# Patient Record
Sex: Female | Born: 1950
Health system: Southern US, Community
[De-identification: ages and names within clinical notes are randomized; demographics above are authoritative.]

## PROBLEM LIST (undated history)

## (undated) DIAGNOSIS — E785 Hyperlipidemia, unspecified: Secondary | ICD-10-CM

## (undated) DIAGNOSIS — E119 Type 2 diabetes mellitus without complications: Secondary | ICD-10-CM

## (undated) DIAGNOSIS — I219 Acute myocardial infarction, unspecified: Secondary | ICD-10-CM

## (undated) DIAGNOSIS — H269 Unspecified cataract: Secondary | ICD-10-CM

## (undated) DIAGNOSIS — M1990 Unspecified osteoarthritis, unspecified site: Secondary | ICD-10-CM

## (undated) DIAGNOSIS — I10 Essential (primary) hypertension: Secondary | ICD-10-CM

## (undated) HISTORY — DX: Essential (primary) hypertension: I10

## (undated) HISTORY — DX: Hyperlipidemia, unspecified: E78.5

## (undated) HISTORY — DX: Unspecified cataract: H26.9

## (undated) HISTORY — DX: Unspecified osteoarthritis, unspecified site: M19.90

## (undated) HISTORY — DX: Type 2 diabetes mellitus without complications: E11.9

## (undated) HISTORY — PX: CORONARY ARTERY BYPASS GRAFT: SHX141

## (undated) HISTORY — PX: HERNIA REPAIR: SHX51

## (undated) HISTORY — DX: Acute myocardial infarction, unspecified: I21.9

## (undated) HISTORY — PX: CARDIAC SURGERY: SHX584

---

## 2009-12-16 DIAGNOSIS — I219 Acute myocardial infarction, unspecified: Secondary | ICD-10-CM

## 2009-12-16 HISTORY — DX: Acute myocardial infarction, unspecified: I21.9

## 2013-04-09 DIAGNOSIS — E119 Type 2 diabetes mellitus without complications: Secondary | ICD-10-CM | POA: Diagnosis not present

## 2013-04-09 DIAGNOSIS — R05 Cough: Secondary | ICD-10-CM | POA: Diagnosis not present

## 2013-04-09 DIAGNOSIS — E7800 Pure hypercholesterolemia, unspecified: Secondary | ICD-10-CM | POA: Diagnosis not present

## 2013-04-09 DIAGNOSIS — Z1159 Encounter for screening for other viral diseases: Secondary | ICD-10-CM | POA: Diagnosis not present

## 2013-04-09 DIAGNOSIS — I10 Essential (primary) hypertension: Secondary | ICD-10-CM | POA: Diagnosis not present

## 2013-07-20 DIAGNOSIS — I10 Essential (primary) hypertension: Secondary | ICD-10-CM | POA: Diagnosis not present

## 2013-07-20 DIAGNOSIS — E119 Type 2 diabetes mellitus without complications: Secondary | ICD-10-CM | POA: Diagnosis not present

## 2013-07-20 DIAGNOSIS — I2510 Atherosclerotic heart disease of native coronary artery without angina pectoris: Secondary | ICD-10-CM | POA: Diagnosis not present

## 2013-07-20 DIAGNOSIS — L258 Unspecified contact dermatitis due to other agents: Secondary | ICD-10-CM | POA: Diagnosis not present

## 2013-07-20 DIAGNOSIS — F17200 Nicotine dependence, unspecified, uncomplicated: Secondary | ICD-10-CM | POA: Diagnosis not present

## 2013-07-20 DIAGNOSIS — M79609 Pain in unspecified limb: Secondary | ICD-10-CM | POA: Diagnosis not present

## 2013-10-20 DIAGNOSIS — H6690 Otitis media, unspecified, unspecified ear: Secondary | ICD-10-CM | POA: Diagnosis not present

## 2013-10-20 DIAGNOSIS — F4323 Adjustment disorder with mixed anxiety and depressed mood: Secondary | ICD-10-CM | POA: Diagnosis not present

## 2013-10-20 DIAGNOSIS — E119 Type 2 diabetes mellitus without complications: Secondary | ICD-10-CM | POA: Diagnosis not present

## 2013-11-15 DIAGNOSIS — H35039 Hypertensive retinopathy, unspecified eye: Secondary | ICD-10-CM | POA: Diagnosis not present

## 2013-12-23 DIAGNOSIS — J329 Chronic sinusitis, unspecified: Secondary | ICD-10-CM | POA: Diagnosis not present

## 2013-12-23 DIAGNOSIS — R05 Cough: Secondary | ICD-10-CM | POA: Diagnosis not present

## 2013-12-23 DIAGNOSIS — R059 Cough, unspecified: Secondary | ICD-10-CM | POA: Diagnosis not present

## 2013-12-23 DIAGNOSIS — E119 Type 2 diabetes mellitus without complications: Secondary | ICD-10-CM | POA: Diagnosis not present

## 2013-12-29 DIAGNOSIS — E119 Type 2 diabetes mellitus without complications: Secondary | ICD-10-CM | POA: Diagnosis not present

## 2013-12-29 DIAGNOSIS — H9190 Unspecified hearing loss, unspecified ear: Secondary | ICD-10-CM | POA: Diagnosis not present

## 2014-01-20 DIAGNOSIS — F17200 Nicotine dependence, unspecified, uncomplicated: Secondary | ICD-10-CM | POA: Diagnosis not present

## 2014-01-20 DIAGNOSIS — E119 Type 2 diabetes mellitus without complications: Secondary | ICD-10-CM | POA: Diagnosis not present

## 2014-01-29 LAB — HM MAMMOGRAPHY

## 2014-02-06 DIAGNOSIS — I10 Essential (primary) hypertension: Secondary | ICD-10-CM | POA: Diagnosis not present

## 2014-02-06 DIAGNOSIS — R040 Epistaxis: Secondary | ICD-10-CM | POA: Diagnosis not present

## 2014-02-09 DIAGNOSIS — R040 Epistaxis: Secondary | ICD-10-CM | POA: Diagnosis not present

## 2014-02-16 DIAGNOSIS — R040 Epistaxis: Secondary | ICD-10-CM | POA: Diagnosis not present

## 2014-06-29 ENCOUNTER — Ambulatory Visit (INDEPENDENT_AMBULATORY_CARE_PROVIDER_SITE_OTHER): Payer: Medicare Other | Admitting: Family

## 2014-06-29 ENCOUNTER — Encounter: Payer: Self-pay | Admitting: Family

## 2014-06-29 ENCOUNTER — Encounter (INDEPENDENT_AMBULATORY_CARE_PROVIDER_SITE_OTHER): Payer: Self-pay

## 2014-06-29 VITALS — BP 124/65 | HR 68 | Temp 98.4°F | Ht 62.75 in | Wt 185.0 lb

## 2014-06-29 DIAGNOSIS — I209 Angina pectoris, unspecified: Secondary | ICD-10-CM | POA: Diagnosis not present

## 2014-06-29 DIAGNOSIS — Z130 Encounter for screening for diseases of the blood and blood-forming organs and certain disorders involving the immune mechanism: Secondary | ICD-10-CM | POA: Diagnosis not present

## 2014-06-29 DIAGNOSIS — E785 Hyperlipidemia, unspecified: Secondary | ICD-10-CM

## 2014-06-29 DIAGNOSIS — I25810 Atherosclerosis of coronary artery bypass graft(s) without angina pectoris: Secondary | ICD-10-CM

## 2014-06-29 DIAGNOSIS — I25709 Atherosclerosis of coronary artery bypass graft(s), unspecified, with unspecified angina pectoris: Secondary | ICD-10-CM

## 2014-06-29 DIAGNOSIS — E1169 Type 2 diabetes mellitus with other specified complication: Secondary | ICD-10-CM | POA: Insufficient documentation

## 2014-06-29 DIAGNOSIS — E119 Type 2 diabetes mellitus without complications: Secondary | ICD-10-CM | POA: Insufficient documentation

## 2014-06-29 DIAGNOSIS — E1159 Type 2 diabetes mellitus with other circulatory complications: Secondary | ICD-10-CM | POA: Insufficient documentation

## 2014-06-29 DIAGNOSIS — Z1321 Encounter for screening for nutritional disorder: Secondary | ICD-10-CM

## 2014-06-29 DIAGNOSIS — I10 Essential (primary) hypertension: Secondary | ICD-10-CM | POA: Diagnosis not present

## 2014-06-29 DIAGNOSIS — E559 Vitamin D deficiency, unspecified: Secondary | ICD-10-CM | POA: Diagnosis not present

## 2014-06-29 LAB — POCT GLYCOSYLATED HEMOGLOBIN (HGB A1C): Hemoglobin A1C: 6.9

## 2014-06-29 LAB — POCT UA - MICROALBUMIN: Microalbumin Ur, POC: NEGATIVE mg/L

## 2014-06-29 MED ORDER — ROSUVASTATIN CALCIUM 10 MG PO TABS: 10.0000 mg | ORAL_TABLET | Freq: Every day | ORAL | Status: DC

## 2014-06-29 MED ORDER — CLOPIDOGREL BISULFATE 75 MG PO TABS: 75.0000 mg | ORAL_TABLET | Freq: Every day | ORAL | Status: DC

## 2014-06-29 MED ORDER — SITAGLIPTIN PHOSPHATE 100 MG PO TABS: 100.0000 mg | ORAL_TABLET | Freq: Every day | ORAL | Status: DC

## 2014-06-29 MED ORDER — GLIMEPIRIDE 2 MG PO TABS: 2.0000 mg | ORAL_TABLET | Freq: Every day | ORAL | Status: DC

## 2014-06-29 MED ORDER — METOPROLOL SUCCINATE ER 25 MG PO TB24: 25.0000 mg | ORAL_TABLET | Freq: Every day | ORAL | Status: DC

## 2014-06-29 MED ORDER — QUINAPRIL HCL 10 MG PO TABS: 10.0000 mg | ORAL_TABLET | Freq: Every day | ORAL | Status: DC

## 2014-06-29 NOTE — Patient Instructions (Signed)

## 2014-06-29 NOTE — Progress Notes (Signed)
Subjective:    Patient ID: Heidi Graham, female    DOB: Jul 16, 1951, 63 y.o.   MRN: [id number]  Diabetes She presents for her follow-up diabetic visit. She has type 2 diabetes mellitus. Her disease course has been improving. Pertinent negatives for hypoglycemia include no confusion, dizziness or headaches. Pertinent negatives for diabetes include no blurred vision, no foot paresthesias, no foot ulcerations and no visual change. Pertinent negatives for hypoglycemia complications include no blackouts. Diabetic complications include heart disease. Pertinent negatives for diabetic complications include no CVA or peripheral neuropathy. Risk factors for coronary artery disease include dyslipidemia, diabetes mellitus, hypertension, post-menopausal and sedentary lifestyle. Current diabetic treatment includes oral agent (dual therapy). She is compliant with treatment all of the time. She is following a generally healthy diet. She participates in exercise daily. Her breakfast blood glucose range is generally 130-140 mg/dl. An ACE inhibitor/angiotensin II receptor blocker is being taken. Eye exam is current.  Hyperlipidemia This is a chronic problem. The current episode started more than 1 year ago. The problem is controlled. Recent lipid tests were reviewed and are normal. Exacerbating diseases include diabetes and obesity. She has no history of hypothyroidism. Factors aggravating her hyperlipidemia include smoking and fatty foods. Pertinent negatives include no focal sensory loss, leg pain, myalgias or shortness of breath. Current antihyperlipidemic treatment includes statins. The current treatment provides significant improvement of lipids. Risk factors for coronary artery disease include diabetes mellitus, dyslipidemia, hypertension and post-menopausal.  Hypertension This is a chronic problem. The current episode started more than 1 year ago. The problem has been resolved since onset. The problem is controlled.  Pertinent negatives include no blurred vision, headaches, palpitations, peripheral edema or shortness of breath. Risk factors for coronary artery disease include diabetes mellitus, dyslipidemia, obesity, post-menopausal state and smoking/tobacco exposure. Past treatments include beta blockers and ACE inhibitors. The current treatment provides significant improvement. Hypertensive end-organ damage includes CAD/MI. There is no history of kidney disease, CVA, heart failure or a thyroid problem. There is no history of sleep apnea.   Pt presents to the office to establish care. Pt has the following chronic conditions:   Review of Systems  Constitutional: Negative.   HENT: Negative.   Eyes: Negative.  Negative for blurred vision.  Respiratory: Negative.  Negative for shortness of breath.   Cardiovascular: Negative.  Negative for palpitations.  Gastrointestinal: Negative.   Endocrine: Negative.   Genitourinary: Negative.   Musculoskeletal: Negative.  Negative for myalgias.  Neurological: Negative.  Negative for dizziness and headaches.  Hematological: Negative.   Psychiatric/Behavioral: Negative.  Negative for confusion.  All other systems reviewed and are negative.      Objective:   Physical Exam  Vitals reviewed. Constitutional: She is oriented to person, place, and time. She appears well-developed and well-nourished. No distress.  HENT:  Head: Normocephalic and atraumatic.  Right Ear: External ear normal.  Mouth/Throat: Oropharynx is clear and moist.  Eyes: Pupils are equal, round, and reactive to light.  Neck: Normal range of motion. Neck supple. No thyromegaly present.  Cardiovascular: Normal rate, regular rhythm, normal heart sounds and intact distal pulses.   No murmur heard. Pulmonary/Chest: Effort normal and breath sounds normal. No respiratory distress. She has no wheezes.  Abdominal: Soft. Bowel sounds are normal. She exhibits no distension. There is no tenderness.    Musculoskeletal: Normal range of motion. She exhibits no edema and no tenderness.  Neurological: She is alert and oriented to person, place, and time. She has normal reflexes. No cranial nerve  deficit.  Skin: Skin is warm and dry.  Psychiatric: She has a normal mood and affect. Her behavior is normal. Judgment and thought content normal.    BP 124/65  Pulse 68  Temp(Src) 98.4 F (36.9 C) (Oral)  Ht 5' 2.75" (1.594 m)  Wt 185 lb (83.915 kg)  BMI 33.03 kg/m2       Assessment & Plan:  1. Type 2 diabetes mellitus without complication - POCT glycosylated hemoglobin (Hb A1C) - POCT UA - Microalbumin - CMP14+EGFR - sitaGLIPtin (JANUVIA) 100 MG tablet; Take 1 tablet (100 mg total) by mouth daily.  Dispense: 90 tablet; Refill: 3 - glimepiride (AMARYL) 2 MG tablet; Take 1 tablet (2 mg total) by mouth daily with breakfast.  Dispense: 90 tablet; Refill: 3  2. Hyperlipidemia - Lipid panel - rosuvastatin (CRESTOR) 10 MG tablet; Take 1 tablet (10 mg total) by mouth at bedtime.  Dispense: 90 tablet; Refill: 2  3. Essential hypertension, benign - CMP14+EGFR - quinapril (ACCUPRIL) 10 MG tablet; Take 1 tablet (10 mg total) by mouth daily.  Dispense: 90 tablet; Refill: 3 - metoprolol succinate (TOPROL-XL) 25 MG 24 hr tablet; Take 1 tablet (25 mg total) by mouth daily.  Dispense: 90 tablet; Refill: 3  4. Coronary artery disease involving coronary bypass graft with unspecified angina pectoris - clopidogrel (PLAVIX) 75 MG tablet; Take 1 tablet (75 mg total) by mouth daily.  Dispense: 90 tablet; Refill: 3  5. Encounter for vitamin deficiency screening - Vit D  25 hydroxy (rtn osteoporosis monitoring)   Continue all meds Labs pending Health Maintenance reviewed Smoking cessation discussed Diet and exercise encouraged RTO 3 months  Evelina Dun, FNP

## 2014-06-30 LAB — CMP14+EGFR
A/G RATIO: 1.7 (ref 1.1–2.5)
ALBUMIN: 4.3 g/dL (ref 3.6–4.8)
ALK PHOS: 71 IU/L (ref 39–117)
ALT: 12 IU/L (ref 0–32)
AST: 13 IU/L (ref 0–40)
BILIRUBIN TOTAL: 0.4 mg/dL (ref 0.0–1.2)
BUN/Creatinine Ratio: 17 (ref 11–26)
BUN: 15 mg/dL (ref 8–27)
CO2: 25 mmol/L (ref 18–29)
Calcium: 9.5 mg/dL (ref 8.7–10.3)
Chloride: 102 mmol/L (ref 97–108)
Creatinine, Ser: 0.87 mg/dL (ref 0.57–1.00)
GFR calc non Af Amer: 71 mL/min/{1.73_m2} (ref >59)
GFR, EST AFRICAN AMERICAN: 82 mL/min/{1.73_m2} (ref >59)
Globulin, Total: 2.5 g/dL (ref 1.5–4.5)
Glucose: 153 mg/dL — ABNORMAL HIGH (ref 65–99)
Potassium: 4.7 mmol/L (ref 3.5–5.2)
Sodium: 142 mmol/L (ref 134–144)
Total Protein: 6.8 g/dL (ref 6.0–8.5)

## 2014-06-30 LAB — LIPID PANEL
Chol/HDL Ratio: 5.1 ratio units — ABNORMAL HIGH (ref 0.0–4.4)
Cholesterol, Total: 173 mg/dL (ref 100–199)
HDL: 34 mg/dL — ABNORMAL LOW (ref >39)
LDL Calculated: 110 mg/dL — ABNORMAL HIGH (ref 0–99)
Triglycerides: 147 mg/dL (ref 0–149)
VLDL Cholesterol Cal: 29 mg/dL (ref 5–40)

## 2014-06-30 LAB — VITAMIN D 25 HYDROXY (VIT D DEFICIENCY, FRACTURES): Vit D, 25-Hydroxy: 39.5 ng/mL (ref 30.0–100.0)

## 2014-08-26 ENCOUNTER — Encounter: Payer: Self-pay | Admitting: *Deleted

## 2014-10-04 ENCOUNTER — Ambulatory Visit: Payer: Medicare Other | Admitting: Family Medicine

## 2014-10-06 ENCOUNTER — Ambulatory Visit: Payer: Medicare Other | Admitting: Family Medicine

## 2014-11-07 ENCOUNTER — Telehealth: Payer: Self-pay | Admitting: Family

## 2014-11-17 ENCOUNTER — Encounter: Payer: Self-pay | Admitting: Family Medicine

## 2014-11-17 ENCOUNTER — Ambulatory Visit (INDEPENDENT_AMBULATORY_CARE_PROVIDER_SITE_OTHER): Payer: Medicare Other | Admitting: Family Medicine

## 2014-11-17 ENCOUNTER — Ambulatory Visit (INDEPENDENT_AMBULATORY_CARE_PROVIDER_SITE_OTHER): Payer: Medicare Other | Admitting: *Deleted

## 2014-11-17 VITALS — BP 134/66 | HR 67 | Temp 98.7°F | Ht 62.75 in | Wt 183.2 lb

## 2014-11-17 DIAGNOSIS — I10 Essential (primary) hypertension: Secondary | ICD-10-CM | POA: Diagnosis not present

## 2014-11-17 DIAGNOSIS — E119 Type 2 diabetes mellitus without complications: Secondary | ICD-10-CM | POA: Diagnosis not present

## 2014-11-17 DIAGNOSIS — E785 Hyperlipidemia, unspecified: Secondary | ICD-10-CM | POA: Diagnosis not present

## 2014-11-17 DIAGNOSIS — I25709 Atherosclerosis of coronary artery bypass graft(s), unspecified, with unspecified angina pectoris: Secondary | ICD-10-CM

## 2014-11-17 DIAGNOSIS — Z23 Encounter for immunization: Secondary | ICD-10-CM | POA: Diagnosis not present

## 2014-11-17 LAB — POCT GLYCOSYLATED HEMOGLOBIN (HGB A1C): Hemoglobin A1C: 6.5

## 2014-11-17 MED ORDER — ROSUVASTATIN CALCIUM 10 MG PO TABS: 10.0000 mg | ORAL_TABLET | Freq: Every day | ORAL | Status: DC

## 2014-11-17 MED ORDER — QUINAPRIL HCL 10 MG PO TABS: 10.0000 mg | ORAL_TABLET | Freq: Every day | ORAL | Status: DC

## 2014-11-17 MED ORDER — GLIMEPIRIDE 2 MG PO TABS: 2.0000 mg | ORAL_TABLET | Freq: Every day | ORAL | Status: DC

## 2014-11-17 NOTE — Progress Notes (Signed)
   Subjective:    Patient ID: Heidi Graham, female    DOB: 11/27/1951, 63 y.o.   MRN: [id number]  HPI 63 year old female who is here to follow-up her diabetes. She monitors her sugars at home twice a day and numbers generally are around 1 10 in the morning and less than 150 at night. Last A1c 5 months ago was 6.9. Generally she is compliant with diet as well as medications which include Amaryl and Januvia. We talked briefly about trying to leave off the Amaryl to reduce her pill "burden" but will wait till after the holidays to do that.  She denies any symptoms suggestive of neuropathy. She has no chest pain. She did have coronary bypass surgery in 2011 and continues with Plavix and metoprolol for that.    Review of Systems  Constitutional: Negative.   HENT: Negative.   Eyes: Negative.   Respiratory: Negative.   Cardiovascular: Negative.   Gastrointestinal: Negative.   Endocrine: Negative.   Genitourinary: Negative.   Hematological: Negative.   Psychiatric/Behavioral: Negative.        Objective:   Physical Exam  Constitutional: She is oriented to person, place, and time. She appears well-developed and well-nourished.  Eyes: Conjunctivae and EOM are normal.  Vasculature and optic fundi appear benign  Neck: Normal range of motion. Neck supple.  Cardiovascular: Normal rate, regular rhythm and normal heart sounds.   Pulmonary/Chest: Effort normal and breath sounds normal.  Abdominal: Soft. Bowel sounds are normal.  Musculoskeletal: Normal range of motion.  Neurological: She is alert and oriented to person, place, and time. She has normal reflexes.  Skin: Skin is warm and dry.  Psychiatric: She has a normal mood and affect. Her behavior is normal. Thought content normal.    BP 134/66 mmHg  Pulse 67  Temp(Src) 98.7 F (37.1 C) (Oral)  Ht 5' 2.75" (1.594 m)  Wt 183 lb 3.2 oz (83.099 kg)  BMI 32.71 kg/m2      Assessment & Plan:  1. Type 2 diabetes mellitus without  complication  - POCT glycosylated hemoglobin (Hb A1C)  2. Essential hypertension, benign   3. Hyperlipidemia Not quite at LDL goal; consider increase Crestor to 20 mg  Frederica KusterStephen M Tristian Sickinger MD

## 2015-03-20 ENCOUNTER — Encounter: Payer: Self-pay | Admitting: Family

## 2015-03-20 ENCOUNTER — Ambulatory Visit (INDEPENDENT_AMBULATORY_CARE_PROVIDER_SITE_OTHER): Payer: Medicare Other | Admitting: Family

## 2015-03-20 VITALS — BP 113/59 | HR 73 | Temp 98.2°F | Ht 62.75 in | Wt 188.6 lb

## 2015-03-20 DIAGNOSIS — E119 Type 2 diabetes mellitus without complications: Secondary | ICD-10-CM

## 2015-03-20 DIAGNOSIS — Z1321 Encounter for screening for nutritional disorder: Secondary | ICD-10-CM | POA: Diagnosis not present

## 2015-03-20 DIAGNOSIS — I10 Essential (primary) hypertension: Secondary | ICD-10-CM

## 2015-03-20 DIAGNOSIS — E785 Hyperlipidemia, unspecified: Secondary | ICD-10-CM

## 2015-03-20 LAB — POCT GLYCOSYLATED HEMOGLOBIN (HGB A1C): HEMOGLOBIN A1C: 6.7

## 2015-03-20 NOTE — Patient Instructions (Signed)

## 2015-03-20 NOTE — Progress Notes (Signed)
Subjective:    Patient ID: Heidi Graham, female    DOB: 1951/06/29, 64 y.o.   MRN: [id number]  Diabetes She presents for her follow-up diabetic visit. She has type 2 diabetes mellitus. Her disease course has been improving. Pertinent negatives for hypoglycemia include no confusion, dizziness or headaches. Pertinent negatives for diabetes include no blurred vision, no foot paresthesias, no foot ulcerations and no visual change. Pertinent negatives for hypoglycemia complications include no blackouts. Diabetic complications include heart disease. Pertinent negatives for diabetic complications include no CVA or peripheral neuropathy. Risk factors for coronary artery disease include dyslipidemia, diabetes mellitus, hypertension, post-menopausal and sedentary lifestyle. Current diabetic treatment includes oral agent (dual therapy). She is compliant with treatment all of the time. She is following a generally healthy diet. She participates in exercise daily. Her breakfast blood glucose range is generally 130-140 mg/dl. An ACE inhibitor/angiotensin II receptor blocker is being taken. Eye exam is current.  Hyperlipidemia This is a chronic problem. The current episode started more than 1 year ago. The problem is uncontrolled. Recent lipid tests were reviewed and are high. Exacerbating diseases include diabetes. She has no history of hypothyroidism. Pertinent negatives include no leg pain, myalgias or shortness of breath. Current antihyperlipidemic treatment includes statins. The current treatment provides significant improvement of lipids. Risk factors for coronary artery disease include dyslipidemia, diabetes mellitus, hypertension, obesity, post-menopausal and family history.  Hypertension This is a chronic problem. The current episode started more than 1 year ago. The problem has been resolved since onset. The problem is controlled. Pertinent negatives include no blurred vision, headaches, malaise/fatigue,  palpitations, peripheral edema or shortness of breath. Risk factors for coronary artery disease include dyslipidemia, diabetes mellitus, family history, obesity and post-menopausal state. Past treatments include ACE inhibitors and beta blockers. The current treatment provides significant improvement. There is no history of kidney disease, CAD/MI, CVA, heart failure or a thyroid problem. There is no history of sleep apnea.      Review of Systems  Constitutional: Negative.  Negative for malaise/fatigue.  HENT: Negative.   Eyes: Negative.  Negative for blurred vision.  Respiratory: Negative.  Negative for shortness of breath.   Cardiovascular: Negative.  Negative for palpitations.  Gastrointestinal: Negative.   Endocrine: Negative.   Genitourinary: Negative.   Musculoskeletal: Negative.  Negative for myalgias.  Neurological: Negative.  Negative for dizziness and headaches.  Hematological: Negative.   Psychiatric/Behavioral: Negative.  Negative for confusion.  All other systems reviewed and are negative.      Objective:   Physical Exam  Constitutional: She is oriented to person, place, and time. She appears well-developed and well-nourished. No distress.  HENT:  Head: Normocephalic and atraumatic.  Right Ear: External ear normal.  Mouth/Throat: Oropharynx is clear and moist.  Eyes: Pupils are equal, round, and reactive to light.  Neck: Normal range of motion. Neck supple. No thyromegaly present.  Cardiovascular: Normal rate, regular rhythm, normal heart sounds and intact distal pulses.   No murmur heard. Pulmonary/Chest: Effort normal and breath sounds normal. No respiratory distress. She has no wheezes.  Abdominal: Soft. Bowel sounds are normal. She exhibits no distension. There is no tenderness.  Musculoskeletal: Normal range of motion. She exhibits no edema or tenderness.  Neurological: She is alert and oriented to person, place, and time. She has normal reflexes. No cranial nerve  deficit.  Skin: Skin is warm and dry.  Psychiatric: She has a normal mood and affect. Her behavior is normal. Judgment and thought content normal.  Vitals  reviewed.     BP 113/59 mmHg  Pulse 73  Temp(Src) 98.2 F (36.8 C) (Oral)  Ht 5' 2.75" (1.594 m)  Wt 188 lb 9.6 oz (85.548 kg)  BMI 33.67 kg/m2     Assessment & Plan:  1. Essential hypertension, benign - CMP14+EGFR  2. Type 2 diabetes mellitus without complication - POCT glycosylated hemoglobin (Hb A1C) - CMP14+EGFR  3. Hyperlipidemia - CMP14+EGFR - Lipid panel  4. Encounter for vitamin deficiency screening   Continue all meds Labs pending Health Maintenance reviewed Diet and exercise encouraged RTO 6 months  Evelina Dun, FNP

## 2015-03-21 LAB — LIPID PANEL
Chol/HDL Ratio: 4.8 ratio units — ABNORMAL HIGH (ref 0.0–4.4)
Cholesterol, Total: 163 mg/dL (ref 100–199)
HDL: 34 mg/dL — ABNORMAL LOW (ref >39)
LDL Calculated: 96 mg/dL (ref 0–99)
Triglycerides: 163 mg/dL — ABNORMAL HIGH (ref 0–149)
VLDL CHOLESTEROL CAL: 33 mg/dL (ref 5–40)

## 2015-03-21 LAB — CMP14+EGFR
ALT: 13 IU/L (ref 0–32)
AST: 17 IU/L (ref 0–40)
Albumin/Globulin Ratio: 1.6 (ref 1.1–2.5)
Albumin: 4.1 g/dL (ref 3.6–4.8)
Alkaline Phosphatase: 69 IU/L (ref 39–117)
BUN/Creatinine Ratio: 20 (ref 11–26)
BUN: 15 mg/dL (ref 8–27)
Bilirubin Total: 0.3 mg/dL (ref 0.0–1.2)
CALCIUM: 9.5 mg/dL (ref 8.7–10.3)
CHLORIDE: 104 mmol/L (ref 97–108)
CO2: 25 mmol/L (ref 18–29)
Creatinine, Ser: 0.74 mg/dL (ref 0.57–1.00)
GFR calc Af Amer: 99 mL/min/{1.73_m2} (ref >59)
GFR, EST NON AFRICAN AMERICAN: 86 mL/min/{1.73_m2} (ref >59)
GLUCOSE: 158 mg/dL — AB (ref 65–99)
Globulin, Total: 2.5 g/dL (ref 1.5–4.5)
POTASSIUM: 4.6 mmol/L (ref 3.5–5.2)
Sodium: 141 mmol/L (ref 134–144)
TOTAL PROTEIN: 6.6 g/dL (ref 6.0–8.5)

## 2015-03-23 ENCOUNTER — Telehealth: Payer: Self-pay | Admitting: *Deleted

## 2015-03-23 NOTE — Telephone Encounter (Signed)
Pt notified of results Verbalizes understanding 

## 2015-03-23 NOTE — Telephone Encounter (Signed)
-----   Message from Junie Spencerhristy A Hawks, FNP sent at 03/22/2015 11:14 AM EDT ----- HgbA1C WNL Kidney and liver function stable Cholesterol levels improved- Continue current meds- low fat diet and exercise and recheck in 3 months

## 2015-03-31 DIAGNOSIS — I2510 Atherosclerotic heart disease of native coronary artery without angina pectoris: Secondary | ICD-10-CM | POA: Diagnosis not present

## 2015-03-31 DIAGNOSIS — Z79899 Other long term (current) drug therapy: Secondary | ICD-10-CM | POA: Diagnosis not present

## 2015-03-31 DIAGNOSIS — R040 Epistaxis: Secondary | ICD-10-CM | POA: Diagnosis not present

## 2015-03-31 DIAGNOSIS — E119 Type 2 diabetes mellitus without complications: Secondary | ICD-10-CM | POA: Diagnosis not present

## 2015-03-31 DIAGNOSIS — I10 Essential (primary) hypertension: Secondary | ICD-10-CM | POA: Diagnosis not present

## 2015-03-31 DIAGNOSIS — Z7902 Long term (current) use of antithrombotics/antiplatelets: Secondary | ICD-10-CM | POA: Diagnosis not present

## 2015-03-31 DIAGNOSIS — F17200 Nicotine dependence, unspecified, uncomplicated: Secondary | ICD-10-CM | POA: Diagnosis not present

## 2015-03-31 DIAGNOSIS — R030 Elevated blood-pressure reading, without diagnosis of hypertension: Secondary | ICD-10-CM | POA: Diagnosis not present

## 2015-04-04 DIAGNOSIS — R040 Epistaxis: Secondary | ICD-10-CM | POA: Diagnosis not present

## 2015-04-04 DIAGNOSIS — Z7901 Long term (current) use of anticoagulants: Secondary | ICD-10-CM | POA: Diagnosis not present

## 2015-04-04 DIAGNOSIS — I2510 Atherosclerotic heart disease of native coronary artery without angina pectoris: Secondary | ICD-10-CM | POA: Diagnosis not present

## 2015-04-04 DIAGNOSIS — F17200 Nicotine dependence, unspecified, uncomplicated: Secondary | ICD-10-CM | POA: Diagnosis not present

## 2015-04-04 DIAGNOSIS — E119 Type 2 diabetes mellitus without complications: Secondary | ICD-10-CM | POA: Diagnosis not present

## 2015-04-04 DIAGNOSIS — I10 Essential (primary) hypertension: Secondary | ICD-10-CM | POA: Diagnosis not present

## 2015-04-04 DIAGNOSIS — Z79899 Other long term (current) drug therapy: Secondary | ICD-10-CM | POA: Diagnosis not present

## 2015-04-06 DIAGNOSIS — Z7901 Long term (current) use of anticoagulants: Secondary | ICD-10-CM | POA: Diagnosis not present

## 2015-04-06 DIAGNOSIS — R040 Epistaxis: Secondary | ICD-10-CM | POA: Diagnosis not present

## 2015-04-14 DIAGNOSIS — R040 Epistaxis: Secondary | ICD-10-CM | POA: Diagnosis not present

## 2015-04-14 DIAGNOSIS — Z7901 Long term (current) use of anticoagulants: Secondary | ICD-10-CM | POA: Diagnosis not present

## 2015-05-01 ENCOUNTER — Ambulatory Visit: Payer: Medicare Other | Admitting: Physician Assistant

## 2015-07-18 ENCOUNTER — Encounter: Payer: Self-pay | Admitting: *Deleted

## 2015-07-20 ENCOUNTER — Other Ambulatory Visit: Payer: Self-pay | Admitting: Family

## 2015-08-19 ENCOUNTER — Other Ambulatory Visit: Payer: Self-pay | Admitting: Family

## 2015-08-22 NOTE — Telephone Encounter (Signed)
Last seen 03/20/15  Heidi Graham 

## 2015-08-31 ENCOUNTER — Ambulatory Visit: Payer: Medicare Other | Admitting: Pediatrics

## 2015-09-01 ENCOUNTER — Encounter: Payer: Self-pay | Admitting: Family

## 2015-09-17 DIAGNOSIS — R072 Precordial pain: Secondary | ICD-10-CM | POA: Diagnosis not present

## 2015-09-17 DIAGNOSIS — E785 Hyperlipidemia, unspecified: Secondary | ICD-10-CM | POA: Diagnosis not present

## 2015-09-17 DIAGNOSIS — Z23 Encounter for immunization: Secondary | ICD-10-CM | POA: Diagnosis not present

## 2015-09-17 DIAGNOSIS — I214 Non-ST elevation (NSTEMI) myocardial infarction: Secondary | ICD-10-CM | POA: Diagnosis not present

## 2015-09-17 DIAGNOSIS — I10 Essential (primary) hypertension: Secondary | ICD-10-CM | POA: Diagnosis not present

## 2015-09-17 DIAGNOSIS — Z951 Presence of aortocoronary bypass graft: Secondary | ICD-10-CM | POA: Diagnosis not present

## 2015-09-17 DIAGNOSIS — F17200 Nicotine dependence, unspecified, uncomplicated: Secondary | ICD-10-CM | POA: Diagnosis not present

## 2015-09-17 DIAGNOSIS — Z79899 Other long term (current) drug therapy: Secondary | ICD-10-CM | POA: Diagnosis not present

## 2015-09-17 DIAGNOSIS — R0789 Other chest pain: Secondary | ICD-10-CM | POA: Diagnosis not present

## 2015-09-17 DIAGNOSIS — I2510 Atherosclerotic heart disease of native coronary artery without angina pectoris: Secondary | ICD-10-CM | POA: Diagnosis not present

## 2015-09-17 DIAGNOSIS — M79602 Pain in left arm: Secondary | ICD-10-CM | POA: Diagnosis not present

## 2015-09-18 DIAGNOSIS — R072 Precordial pain: Secondary | ICD-10-CM | POA: Diagnosis not present

## 2015-09-18 DIAGNOSIS — Z951 Presence of aortocoronary bypass graft: Secondary | ICD-10-CM | POA: Diagnosis not present

## 2015-09-18 DIAGNOSIS — I214 Non-ST elevation (NSTEMI) myocardial infarction: Secondary | ICD-10-CM | POA: Diagnosis not present

## 2015-09-18 DIAGNOSIS — F17200 Nicotine dependence, unspecified, uncomplicated: Secondary | ICD-10-CM | POA: Diagnosis not present

## 2015-09-18 DIAGNOSIS — I2510 Atherosclerotic heart disease of native coronary artery without angina pectoris: Secondary | ICD-10-CM | POA: Diagnosis not present

## 2015-09-19 ENCOUNTER — Ambulatory Visit: Payer: Medicare Other | Admitting: Family

## 2015-09-19 DIAGNOSIS — Z951 Presence of aortocoronary bypass graft: Secondary | ICD-10-CM | POA: Diagnosis not present

## 2015-09-19 DIAGNOSIS — I200 Unstable angina: Secondary | ICD-10-CM | POA: Diagnosis not present

## 2015-09-19 DIAGNOSIS — I517 Cardiomegaly: Secondary | ICD-10-CM | POA: Diagnosis not present

## 2015-09-19 DIAGNOSIS — E669 Obesity, unspecified: Secondary | ICD-10-CM | POA: Insufficient documentation

## 2015-09-19 DIAGNOSIS — I080 Rheumatic disorders of both mitral and aortic valves: Secondary | ICD-10-CM | POA: Diagnosis not present

## 2015-09-19 DIAGNOSIS — Z23 Encounter for immunization: Secondary | ICD-10-CM | POA: Diagnosis not present

## 2015-09-19 DIAGNOSIS — F17210 Nicotine dependence, cigarettes, uncomplicated: Secondary | ICD-10-CM | POA: Diagnosis present

## 2015-09-19 DIAGNOSIS — E785 Hyperlipidemia, unspecified: Secondary | ICD-10-CM | POA: Insufficient documentation

## 2015-09-19 DIAGNOSIS — I2510 Atherosclerotic heart disease of native coronary artery without angina pectoris: Secondary | ICD-10-CM | POA: Diagnosis not present

## 2015-09-19 DIAGNOSIS — I214 Non-ST elevation (NSTEMI) myocardial infarction: Secondary | ICD-10-CM | POA: Diagnosis not present

## 2015-09-19 DIAGNOSIS — R931 Abnormal findings on diagnostic imaging of heart and coronary circulation: Secondary | ICD-10-CM | POA: Diagnosis not present

## 2015-09-19 DIAGNOSIS — I10 Essential (primary) hypertension: Secondary | ICD-10-CM | POA: Diagnosis not present

## 2015-09-19 DIAGNOSIS — Z720 Tobacco use: Secondary | ICD-10-CM | POA: Diagnosis not present

## 2015-09-19 DIAGNOSIS — E119 Type 2 diabetes mellitus without complications: Secondary | ICD-10-CM | POA: Diagnosis not present

## 2015-09-19 DIAGNOSIS — R079 Chest pain, unspecified: Secondary | ICD-10-CM | POA: Diagnosis not present

## 2015-09-19 DIAGNOSIS — Z7984 Long term (current) use of oral hypoglycemic drugs: Secondary | ICD-10-CM | POA: Diagnosis not present

## 2015-09-19 DIAGNOSIS — I259 Chronic ischemic heart disease, unspecified: Secondary | ICD-10-CM | POA: Diagnosis present

## 2015-09-19 DIAGNOSIS — Z6834 Body mass index (BMI) 34.0-34.9, adult: Secondary | ICD-10-CM | POA: Diagnosis not present

## 2015-09-19 DIAGNOSIS — I25110 Atherosclerotic heart disease of native coronary artery with unstable angina pectoris: Secondary | ICD-10-CM | POA: Diagnosis not present

## 2015-09-19 DIAGNOSIS — E1169 Type 2 diabetes mellitus with other specified complication: Secondary | ICD-10-CM | POA: Insufficient documentation

## 2015-09-19 DIAGNOSIS — I519 Heart disease, unspecified: Secondary | ICD-10-CM | POA: Diagnosis not present

## 2015-09-19 DIAGNOSIS — Z716 Tobacco abuse counseling: Secondary | ICD-10-CM | POA: Diagnosis not present

## 2015-09-19 DIAGNOSIS — I252 Old myocardial infarction: Secondary | ICD-10-CM | POA: Diagnosis not present

## 2015-09-20 ENCOUNTER — Encounter: Payer: Self-pay | Admitting: Family

## 2015-09-27 DIAGNOSIS — E785 Hyperlipidemia, unspecified: Secondary | ICD-10-CM | POA: Diagnosis not present

## 2015-09-27 DIAGNOSIS — I200 Unstable angina: Secondary | ICD-10-CM | POA: Diagnosis not present

## 2015-09-27 DIAGNOSIS — I10 Essential (primary) hypertension: Secondary | ICD-10-CM | POA: Diagnosis not present

## 2015-09-27 DIAGNOSIS — Z8679 Personal history of other diseases of the circulatory system: Secondary | ICD-10-CM | POA: Diagnosis not present

## 2015-09-28 ENCOUNTER — Other Ambulatory Visit: Payer: Self-pay | Admitting: Family

## 2015-10-18 ENCOUNTER — Other Ambulatory Visit: Payer: Self-pay | Admitting: Family

## 2015-10-18 NOTE — Telephone Encounter (Signed)
[facility] 03/30/15  [facility]

## 2015-10-19 DIAGNOSIS — Z23 Encounter for immunization: Secondary | ICD-10-CM | POA: Diagnosis not present

## 2015-11-07 DIAGNOSIS — I200 Unstable angina: Secondary | ICD-10-CM | POA: Diagnosis not present

## 2015-11-07 DIAGNOSIS — E785 Hyperlipidemia, unspecified: Secondary | ICD-10-CM | POA: Diagnosis not present

## 2015-11-07 DIAGNOSIS — Z794 Long term (current) use of insulin: Secondary | ICD-10-CM | POA: Diagnosis not present

## 2015-11-07 DIAGNOSIS — R079 Chest pain, unspecified: Secondary | ICD-10-CM | POA: Diagnosis not present

## 2015-11-07 DIAGNOSIS — Z7902 Long term (current) use of antithrombotics/antiplatelets: Secondary | ICD-10-CM | POA: Diagnosis not present

## 2015-11-07 DIAGNOSIS — Z79899 Other long term (current) drug therapy: Secondary | ICD-10-CM | POA: Diagnosis not present

## 2015-11-07 DIAGNOSIS — R0989 Other specified symptoms and signs involving the circulatory and respiratory systems: Secondary | ICD-10-CM | POA: Diagnosis not present

## 2015-11-07 DIAGNOSIS — E119 Type 2 diabetes mellitus without complications: Secondary | ICD-10-CM | POA: Diagnosis not present

## 2015-11-07 DIAGNOSIS — I2510 Atherosclerotic heart disease of native coronary artery without angina pectoris: Secondary | ICD-10-CM | POA: Diagnosis not present

## 2015-11-07 DIAGNOSIS — Z951 Presence of aortocoronary bypass graft: Secondary | ICD-10-CM | POA: Diagnosis not present

## 2015-11-07 DIAGNOSIS — Z7982 Long term (current) use of aspirin: Secondary | ICD-10-CM | POA: Diagnosis not present

## 2015-11-08 DIAGNOSIS — I10 Essential (primary) hypertension: Secondary | ICD-10-CM | POA: Diagnosis not present

## 2015-11-08 DIAGNOSIS — I2510 Atherosclerotic heart disease of native coronary artery without angina pectoris: Secondary | ICD-10-CM | POA: Diagnosis not present

## 2015-11-08 DIAGNOSIS — I517 Cardiomegaly: Secondary | ICD-10-CM | POA: Diagnosis not present

## 2015-11-08 DIAGNOSIS — Z951 Presence of aortocoronary bypass graft: Secondary | ICD-10-CM | POA: Diagnosis not present

## 2015-11-08 DIAGNOSIS — E785 Hyperlipidemia, unspecified: Secondary | ICD-10-CM | POA: Diagnosis not present

## 2015-11-08 DIAGNOSIS — R079 Chest pain, unspecified: Secondary | ICD-10-CM | POA: Diagnosis not present

## 2015-11-09 DIAGNOSIS — R0902 Hypoxemia: Secondary | ICD-10-CM | POA: Diagnosis not present

## 2015-11-09 DIAGNOSIS — I200 Unstable angina: Secondary | ICD-10-CM | POA: Diagnosis not present

## 2015-11-21 ENCOUNTER — Other Ambulatory Visit: Payer: Self-pay | Admitting: Family

## 2015-11-21 NOTE — Telephone Encounter (Signed)
RX refilled. Pt needs chronic follow up

## 2015-11-21 NOTE — Telephone Encounter (Signed)
[facility] 03/20/15  [facility]

## 2015-11-21 NOTE — Telephone Encounter (Signed)
Patient aware that she must be seen for a follow up.

## 2015-11-24 ENCOUNTER — Other Ambulatory Visit: Payer: Self-pay

## 2015-11-24 NOTE — Telephone Encounter (Signed)
Last seen 03/20/15  Heidi Graham 

## 2015-11-27 MED ORDER — GLIMEPIRIDE 2 MG PO TABS: 2.0000 mg | ORAL_TABLET | Freq: Every day | ORAL | Status: DC

## 2015-11-27 NOTE — Telephone Encounter (Signed)
Patient NTBS for follow up and lab work  

## 2015-11-27 NOTE — Telephone Encounter (Signed)
Left message on patients phone that one refill approved and she would NTBS for a visit and follow up lab work

## 2015-12-22 DIAGNOSIS — I10 Essential (primary) hypertension: Secondary | ICD-10-CM | POA: Diagnosis not present

## 2015-12-22 DIAGNOSIS — I25709 Atherosclerosis of coronary artery bypass graft(s), unspecified, with unspecified angina pectoris: Secondary | ICD-10-CM | POA: Diagnosis not present

## 2015-12-22 DIAGNOSIS — E785 Hyperlipidemia, unspecified: Secondary | ICD-10-CM | POA: Diagnosis not present

## 2015-12-30 ENCOUNTER — Other Ambulatory Visit: Payer: Self-pay | Admitting: Family

## 2016-01-16 ENCOUNTER — Telehealth: Payer: Self-pay | Admitting: Family

## 2016-01-21 ENCOUNTER — Other Ambulatory Visit: Payer: Self-pay | Admitting: Family

## 2016-01-22 NOTE — Telephone Encounter (Signed)
Patient NTBS for follow up and lab work  

## 2016-01-22 NOTE — Telephone Encounter (Signed)
Detailed message left for patient that she will need to be seen  

## 2016-01-22 NOTE — Telephone Encounter (Signed)
Last seen 03/20/15  [name] 

## 2016-02-02 ENCOUNTER — Other Ambulatory Visit: Payer: Self-pay | Admitting: Family

## 2016-02-02 NOTE — Telephone Encounter (Signed)
Patient NTBS for follow up and lab work. No more refills

## 2016-02-02 NOTE — Telephone Encounter (Signed)
Please call to schedule an appointment for a check up and lab work.  No further refills until after an office visit.

## 2016-02-17 ENCOUNTER — Other Ambulatory Visit: Payer: Self-pay | Admitting: Family

## 2016-02-19 NOTE — Telephone Encounter (Signed)
Patient NTBS for follow up and lab work  

## 2016-02-19 NOTE — Telephone Encounter (Signed)
Last seen 04/05/15

## 2016-02-25 ENCOUNTER — Other Ambulatory Visit: Payer: Self-pay | Admitting: Family

## 2016-02-26 NOTE — Telephone Encounter (Signed)
Last A1C 03/2015

## 2016-02-26 NOTE — Telephone Encounter (Signed)
Pt needs appt for chronic follow up!!! 

## 2016-02-26 NOTE — Telephone Encounter (Signed)
Patient aware that she will need to be seen  

## 2016-03-13 ENCOUNTER — Other Ambulatory Visit: Payer: Self-pay

## 2016-03-13 NOTE — Telephone Encounter (Signed)
Last seen 03/22/15 Heidi Graham 

## 2016-03-14 MED ORDER — CLOPIDOGREL BISULFATE 75 MG PO TABS: ORAL_TABLET | ORAL | Status: DC

## 2016-03-16 ENCOUNTER — Other Ambulatory Visit: Payer: Self-pay | Admitting: Family

## 2016-04-10 ENCOUNTER — Other Ambulatory Visit: Payer: Self-pay | Admitting: Family

## 2016-04-10 ENCOUNTER — Telehealth: Payer: Self-pay | Admitting: Family

## 2016-04-14 ENCOUNTER — Other Ambulatory Visit: Payer: Self-pay | Admitting: Family

## 2016-04-16 ENCOUNTER — Other Ambulatory Visit: Payer: Self-pay

## 2016-04-16 DIAGNOSIS — I10 Essential (primary) hypertension: Secondary | ICD-10-CM

## 2016-04-16 MED ORDER — QUINAPRIL HCL 10 MG PO TABS: 10.0000 mg | ORAL_TABLET | Freq: Every day | ORAL | Status: DC

## 2016-04-20 ENCOUNTER — Other Ambulatory Visit: Payer: Self-pay | Admitting: Family

## 2016-04-20 DIAGNOSIS — I10 Essential (primary) hypertension: Secondary | ICD-10-CM

## 2016-04-22 MED ORDER — QUINAPRIL HCL 10 MG PO TABS: 10.0000 mg | ORAL_TABLET | Freq: Every day | ORAL | Status: DC

## 2016-04-22 NOTE — Telephone Encounter (Signed)
Please see phone call from 4-26 and advise on refill

## 2016-04-22 NOTE — Telephone Encounter (Signed)
Will refill medications, but still needs to make appt to be seen asap!!

## 2016-05-10 ENCOUNTER — Other Ambulatory Visit: Payer: Self-pay | Admitting: Family

## 2016-05-10 NOTE — Telephone Encounter (Signed)
Patient NTBS for follow up and lab work  

## 2016-05-10 NOTE — Telephone Encounter (Signed)
Last seen 03/22/15

## 2016-05-21 ENCOUNTER — Other Ambulatory Visit: Payer: Self-pay | Admitting: Family

## 2016-05-23 ENCOUNTER — Other Ambulatory Visit: Payer: Self-pay | Admitting: Family

## 2016-05-23 ENCOUNTER — Other Ambulatory Visit: Payer: Self-pay | Admitting: Family Medicine

## 2016-06-03 ENCOUNTER — Telehealth: Payer: Self-pay | Admitting: Family

## 2016-06-07 ENCOUNTER — Other Ambulatory Visit: Payer: Self-pay | Admitting: Family

## 2016-06-10 ENCOUNTER — Other Ambulatory Visit: Payer: Self-pay | Admitting: Family

## 2016-06-11 NOTE — Telephone Encounter (Signed)
Last seen 04/05/15

## 2016-06-24 ENCOUNTER — Other Ambulatory Visit: Payer: Self-pay | Admitting: Family

## 2016-06-25 ENCOUNTER — Telehealth: Payer: Self-pay | Admitting: Family

## 2016-07-14 ENCOUNTER — Other Ambulatory Visit: Payer: Self-pay | Admitting: Family

## 2016-07-22 ENCOUNTER — Other Ambulatory Visit: Payer: Self-pay | Admitting: Family

## 2016-07-22 NOTE — Telephone Encounter (Signed)
Last seen 04/05/15, route to pool if refused

## 2016-07-22 NOTE — Telephone Encounter (Signed)
Patient NTBS for follow up and lab work/. No more refill until seen. I did refill today.

## 2016-07-25 ENCOUNTER — Other Ambulatory Visit: Payer: Self-pay | Admitting: Family

## 2016-07-29 ENCOUNTER — Telehealth: Payer: Self-pay | Admitting: Family

## 2016-08-16 ENCOUNTER — Other Ambulatory Visit: Payer: Self-pay

## 2016-08-16 MED ORDER — SITAGLIPTIN PHOSPHATE 100 MG PO TABS: 100.0000 mg | ORAL_TABLET | Freq: Every day | ORAL | 0 refills | Status: DC

## 2016-08-16 MED ORDER — GLIMEPIRIDE 2 MG PO TABS: ORAL_TABLET | ORAL | 0 refills | Status: DC

## 2016-08-16 MED ORDER — METOPROLOL SUCCINATE ER 25 MG PO TB24: ORAL_TABLET | ORAL | 0 refills | Status: DC

## 2016-08-16 MED ORDER — CLOPIDOGREL BISULFATE 75 MG PO TABS: ORAL_TABLET | ORAL | 0 refills | Status: DC

## 2016-08-20 ENCOUNTER — Telehealth: Payer: Self-pay | Admitting: Physician Assistant

## 2016-08-20 MED ORDER — SITAGLIPTIN PHOSPHATE 100 MG PO TABS: 100.0000 mg | ORAL_TABLET | Freq: Every day | ORAL | 0 refills | Status: DC

## 2016-08-20 NOTE — Telephone Encounter (Signed)
Prescription sent to pharmacy  sent 

## 2016-09-20 ENCOUNTER — Other Ambulatory Visit: Payer: Self-pay | Admitting: Family

## 2016-09-20 NOTE — Telephone Encounter (Signed)
LMOVM that medication can not be refilled at this time appt needs to be made

## 2016-09-21 DIAGNOSIS — I252 Old myocardial infarction: Secondary | ICD-10-CM | POA: Diagnosis not present

## 2016-09-21 DIAGNOSIS — R05 Cough: Secondary | ICD-10-CM | POA: Diagnosis not present

## 2016-09-21 DIAGNOSIS — I10 Essential (primary) hypertension: Secondary | ICD-10-CM | POA: Diagnosis not present

## 2016-09-21 DIAGNOSIS — J40 Bronchitis, not specified as acute or chronic: Secondary | ICD-10-CM | POA: Diagnosis not present

## 2016-09-21 DIAGNOSIS — R1110 Vomiting, unspecified: Secondary | ICD-10-CM | POA: Diagnosis not present

## 2016-09-21 DIAGNOSIS — E119 Type 2 diabetes mellitus without complications: Secondary | ICD-10-CM | POA: Diagnosis not present

## 2016-09-21 DIAGNOSIS — Z7982 Long term (current) use of aspirin: Secondary | ICD-10-CM | POA: Diagnosis not present

## 2016-09-21 DIAGNOSIS — F17200 Nicotine dependence, unspecified, uncomplicated: Secondary | ICD-10-CM | POA: Diagnosis not present

## 2016-09-21 DIAGNOSIS — Z7984 Long term (current) use of oral hypoglycemic drugs: Secondary | ICD-10-CM | POA: Diagnosis not present

## 2016-09-21 DIAGNOSIS — Z720 Tobacco use: Secondary | ICD-10-CM | POA: Diagnosis not present

## 2016-09-21 DIAGNOSIS — Z7902 Long term (current) use of antithrombotics/antiplatelets: Secondary | ICD-10-CM | POA: Diagnosis not present

## 2016-09-21 DIAGNOSIS — Z79899 Other long term (current) drug therapy: Secondary | ICD-10-CM | POA: Diagnosis not present

## 2016-09-21 DIAGNOSIS — R112 Nausea with vomiting, unspecified: Secondary | ICD-10-CM | POA: Diagnosis not present

## 2016-09-21 DIAGNOSIS — I2510 Atherosclerotic heart disease of native coronary artery without angina pectoris: Secondary | ICD-10-CM | POA: Diagnosis not present

## 2016-09-26 ENCOUNTER — Ambulatory Visit (INDEPENDENT_AMBULATORY_CARE_PROVIDER_SITE_OTHER): Payer: Medicare Other | Admitting: Family Medicine

## 2016-09-26 ENCOUNTER — Encounter: Payer: Self-pay | Admitting: Family Medicine

## 2016-09-26 VITALS — BP 135/73 | HR 71 | Temp 98.2°F | Ht 62.75 in | Wt 190.1 lb

## 2016-09-26 DIAGNOSIS — J209 Acute bronchitis, unspecified: Secondary | ICD-10-CM | POA: Diagnosis not present

## 2016-09-26 MED ORDER — HYDROCODONE-HOMATROPINE 5-1.5 MG/5ML PO SYRP: 5.0000 mL | ORAL_SOLUTION | Freq: Three times a day (TID) | ORAL | 0 refills | Status: DC | PRN

## 2016-09-26 NOTE — Progress Notes (Signed)
BP 135/73   Pulse 71   Temp 98.2 F (36.8 C) (Oral)   Ht 5' 2.75" (1.594 m)   Wt 190 lb 2 oz (86.2 kg)   BMI 33.95 kg/m    Subjective:    Patient ID: Heidi Shipperorothy J Meroney, female    DOB: 12/15/1951, 65 y.o.   MRN: [id number]  HPI: Heidi Graham is a 65 y.o. female presenting on 09/26/2016 for Cough and chest congestion (ongoing x 2 weeks; [facility] and placed on Doxycycline bid, Medrol dose pack, and Tessalon perles; patient reports she has not improved; did chest xray and it was clear)   HPI Cough and chest congestion Patient has been having a cough and chest congestion has been ongoing for 2 weeks. She went to the ER 5 days ago and was given doxycycline and Medrol and Tessalon Perles. She stated that she did not improve much from these. She denies any fevers or chills and has been having some shortness of breath and wheezing intermittently. She denies any sick contacts that she knows of. She says the biggest issue that she still having left is her cough which is nonproductive.  Relevant past medical, surgical, family and social history reviewed and updated as indicated. Interim medical history since our last visit reviewed. Allergies and medications reviewed and updated.  Review of Systems  Constitutional: Negative for chills and fever.  HENT: Positive for congestion, postnasal drip, rhinorrhea, sinus pressure, sneezing and sore throat. Negative for ear discharge and ear pain.   Eyes: Negative for pain, redness and visual disturbance.  Respiratory: Positive for cough. Negative for chest tightness, shortness of breath and wheezing.   Cardiovascular: Negative for chest pain and leg swelling.  Genitourinary: Negative for difficulty urinating and dysuria.  Musculoskeletal: Negative for back pain and gait problem.  Skin: Negative for rash.  Neurological: Negative for light-headedness and headaches.  Psychiatric/Behavioral: Negative for agitation and behavioral  problems.  All other systems reviewed and are negative.   Per HPI unless specifically indicated above     Medication List       Accurate as of 09/26/16 12:29 PM. Always use your most recent med list.          benzonatate 100 MG capsule Commonly known as:  TESSALON Take by mouth 3 (three) times daily as needed for cough.   clopidogrel 75 MG tablet Commonly known as:  PLAVIX TAKE 1 TABLET (75 MG TOTAL) BY MOUTH DAILY.   doxycycline 100 MG capsule Commonly known as:  VIBRAMYCIN Take 100 mg by mouth 2 (two) times daily.   glimepiride 2 MG tablet Commonly known as:  AMARYL TAKE 1 TABLET (2 MG TOTAL) BY MOUTH DAILY WITH BREAKFAST.   HYDROcodone-homatropine 5-1.5 MG/5ML syrup Commonly known as:  HYCODAN Take 5 mLs by mouth every 8 (eight) hours as needed for cough.   isosorbide mononitrate 30 MG 24 hr tablet Commonly known as:  IMDUR Take 30 mg by mouth daily.   methylPREDNISolone 4 MG Tbpk tablet Commonly known as:  MEDROL DOSEPAK Take by mouth.   metoprolol succinate 25 MG 24 hr tablet Commonly known as:  TOPROL-XL TAKE 1 TABLET (25 MG TOTAL) BY MOUTH DAILY.   nitroGLYCERIN 0.4 MG SL tablet Commonly known as:  NITROSTAT Place 0.4 mg under the tongue every 5 (five) minutes as needed for chest pain.   quinapril 10 MG tablet Commonly known as:  ACCUPRIL TAKE 1 TABLET (10 MG TOTAL) BY MOUTH DAILY.  rosuvastatin 10 MG tablet Commonly known as:  CRESTOR Take 1 tablet (10 mg total) by mouth at bedtime.   sitaGLIPtin 100 MG tablet Commonly known as:  JANUVIA Take 1 tablet (100 mg total) by mouth daily. Patient must be seen for future refills          Objective:    BP 135/73   Pulse 71   Temp 98.2 F (36.8 C) (Oral)   Ht 5' 2.75" (1.594 m)   Wt 190 lb 2 oz (86.2 kg)   BMI 33.95 kg/m   Wt Readings from Last 3 Encounters:  09/26/16 190 lb 2 oz (86.2 kg)  03/20/15 188 lb 9.6 oz (85.5 kg)  11/17/14 183 lb 3.2 oz (83.1 kg)    Physical Exam    Constitutional: She is oriented to person, place, and time. She appears well-developed and well-nourished. No distress.  HENT:  Right Ear: Tympanic membrane, external ear and ear canal normal.  Left Ear: Tympanic membrane, external ear and ear canal normal.  Nose: Mucosal edema and rhinorrhea present. No epistaxis. Right sinus exhibits no maxillary sinus tenderness and no frontal sinus tenderness. Left sinus exhibits no maxillary sinus tenderness and no frontal sinus tenderness.  Mouth/Throat: Uvula is midline and mucous membranes are normal. Posterior oropharyngeal edema and posterior oropharyngeal erythema present. No oropharyngeal exudate or tonsillar abscesses.  Eyes: Conjunctivae and EOM are normal.  Cardiovascular: Normal rate, regular rhythm, normal heart sounds and intact distal pulses.   No murmur heard. Pulmonary/Chest: Effort normal and breath sounds normal. No respiratory distress. She has no wheezes.  Musculoskeletal: Normal range of motion. She exhibits no edema or tenderness.  Neurological: She is alert and oriented to person, place, and time. Coordination normal.  Skin: Skin is warm and dry. No rash noted. She is not diaphoretic.  Psychiatric: She has a normal mood and affect. Her behavior is normal.  Vitals reviewed.     Assessment & Plan:   Problem List Items Addressed This Visit    None    Visit Diagnoses    Acute bronchitis, unspecified organism    -  Primary   Patient has doxycycline and albuterol inhaler, will add Breo and give Hycodan   Relevant Medications   HYDROcodone-homatropine (HYCODAN) 5-1.5 MG/5ML syrup       Follow up plan: Return if symptoms worsen or fail to improve.  Counseling provided for all of the vaccine components No orders of the defined types were placed in this encounter.   Arville Care, MD [facility] 09/26/2016, 12:29 PM

## 2016-10-21 ENCOUNTER — Other Ambulatory Visit: Payer: Self-pay | Admitting: *Deleted

## 2016-10-21 MED ORDER — QUINAPRIL HCL 10 MG PO TABS: ORAL_TABLET | ORAL | 0 refills | Status: DC

## 2016-10-22 ENCOUNTER — Other Ambulatory Visit: Payer: Self-pay | Admitting: *Deleted

## 2016-10-22 MED ORDER — QUINAPRIL HCL 10 MG PO TABS: ORAL_TABLET | ORAL | 2 refills | Status: DC

## 2016-10-23 ENCOUNTER — Other Ambulatory Visit: Payer: Self-pay | Admitting: *Deleted

## 2016-10-23 MED ORDER — QUINAPRIL HCL 10 MG PO TABS: ORAL_TABLET | ORAL | 2 refills | Status: DC

## 2016-11-11 ENCOUNTER — Other Ambulatory Visit: Payer: Self-pay | Admitting: Family Medicine

## 2016-12-05 DIAGNOSIS — Z7902 Long term (current) use of antithrombotics/antiplatelets: Secondary | ICD-10-CM | POA: Diagnosis not present

## 2016-12-05 DIAGNOSIS — I10 Essential (primary) hypertension: Secondary | ICD-10-CM | POA: Diagnosis not present

## 2016-12-05 DIAGNOSIS — Z8249 Family history of ischemic heart disease and other diseases of the circulatory system: Secondary | ICD-10-CM | POA: Diagnosis not present

## 2016-12-05 DIAGNOSIS — I2510 Atherosclerotic heart disease of native coronary artery without angina pectoris: Secondary | ICD-10-CM | POA: Diagnosis not present

## 2016-12-05 DIAGNOSIS — E119 Type 2 diabetes mellitus without complications: Secondary | ICD-10-CM | POA: Diagnosis not present

## 2016-12-05 DIAGNOSIS — I252 Old myocardial infarction: Secondary | ICD-10-CM | POA: Diagnosis not present

## 2016-12-05 DIAGNOSIS — J069 Acute upper respiratory infection, unspecified: Secondary | ICD-10-CM | POA: Diagnosis not present

## 2016-12-05 DIAGNOSIS — Z833 Family history of diabetes mellitus: Secondary | ICD-10-CM | POA: Diagnosis not present

## 2016-12-05 DIAGNOSIS — F17200 Nicotine dependence, unspecified, uncomplicated: Secondary | ICD-10-CM | POA: Diagnosis not present

## 2016-12-05 DIAGNOSIS — Z7982 Long term (current) use of aspirin: Secondary | ICD-10-CM | POA: Diagnosis not present

## 2016-12-05 DIAGNOSIS — J42 Unspecified chronic bronchitis: Secondary | ICD-10-CM | POA: Diagnosis not present

## 2016-12-05 DIAGNOSIS — Z79899 Other long term (current) drug therapy: Secondary | ICD-10-CM | POA: Diagnosis not present

## 2016-12-11 ENCOUNTER — Emergency Department (HOSPITAL_COMMUNITY): Payer: Medicare Other

## 2016-12-11 ENCOUNTER — Emergency Department (HOSPITAL_COMMUNITY)
Admission: EM | Admit: 2016-12-11 | Discharge: 2016-12-11 | Disposition: A | Discharge status: Home / Self Care | Payer: Medicare Other | Attending: Emergency Medicine | Admitting: Emergency Medicine

## 2016-12-11 ENCOUNTER — Encounter (HOSPITAL_COMMUNITY): Payer: Self-pay | Admitting: Emergency Medicine

## 2016-12-11 DIAGNOSIS — N390 Urinary tract infection, site not specified: Secondary | ICD-10-CM | POA: Diagnosis not present

## 2016-12-11 DIAGNOSIS — J069 Acute upper respiratory infection, unspecified: Secondary | ICD-10-CM | POA: Insufficient documentation

## 2016-12-11 DIAGNOSIS — E119 Type 2 diabetes mellitus without complications: Secondary | ICD-10-CM | POA: Diagnosis not present

## 2016-12-11 DIAGNOSIS — Z7984 Long term (current) use of oral hypoglycemic drugs: Secondary | ICD-10-CM | POA: Diagnosis not present

## 2016-12-11 DIAGNOSIS — J9801 Acute bronchospasm: Secondary | ICD-10-CM | POA: Diagnosis not present

## 2016-12-11 DIAGNOSIS — F17210 Nicotine dependence, cigarettes, uncomplicated: Secondary | ICD-10-CM | POA: Insufficient documentation

## 2016-12-11 DIAGNOSIS — Z79899 Other long term (current) drug therapy: Secondary | ICD-10-CM | POA: Insufficient documentation

## 2016-12-11 DIAGNOSIS — R05 Cough: Secondary | ICD-10-CM | POA: Diagnosis not present

## 2016-12-11 DIAGNOSIS — I10 Essential (primary) hypertension: Secondary | ICD-10-CM | POA: Insufficient documentation

## 2016-12-11 LAB — COMPREHENSIVE METABOLIC PANEL
ALT: 21 U/L (ref 14–54)
ANION GAP: 9 (ref 5–15)
AST: 18 U/L (ref 15–41)
Albumin: 4.1 g/dL (ref 3.5–5.0)
Alkaline Phosphatase: 76 U/L (ref 38–126)
BILIRUBIN TOTAL: 0.7 mg/dL (ref 0.3–1.2)
BUN: 19 mg/dL (ref 6–20)
CHLORIDE: 98 mmol/L — AB (ref 101–111)
CO2: 29 mmol/L (ref 22–32)
Calcium: 9.5 mg/dL (ref 8.9–10.3)
Creatinine, Ser: 0.78 mg/dL (ref 0.44–1.00)
Glucose, Bld: 241 mg/dL — ABNORMAL HIGH (ref 65–99)
POTASSIUM: 3.7 mmol/L (ref 3.5–5.1)
Sodium: 136 mmol/L (ref 135–145)
TOTAL PROTEIN: 7.9 g/dL (ref 6.5–8.1)

## 2016-12-11 LAB — CBC
HCT: 42.7 % (ref 36.0–46.0)
HEMOGLOBIN: 13.9 g/dL (ref 12.0–15.0)
MCH: 31.2 pg (ref 26.0–34.0)
MCHC: 32.6 g/dL (ref 30.0–36.0)
MCV: 96 fL (ref 78.0–100.0)
Platelets: 224 10*3/uL (ref 150–400)
RBC: 4.45 MIL/uL (ref 3.87–5.11)
RDW: 13.2 % (ref 11.5–15.5)
WBC: 12.2 10*3/uL — AB (ref 4.0–10.5)

## 2016-12-11 LAB — URINALYSIS, ROUTINE W REFLEX MICROSCOPIC
BILIRUBIN URINE: NEGATIVE
Glucose, UA: 150 mg/dL — AB
KETONES UR: NEGATIVE mg/dL
NITRITE: POSITIVE — AB
PH: 6 (ref 5.0–8.0)
PROTEIN: NEGATIVE mg/dL
Specific Gravity, Urine: 1.017 (ref 1.005–1.030)

## 2016-12-11 LAB — LIPASE, BLOOD: LIPASE: 21 U/L (ref 11–51)

## 2016-12-11 LAB — RAPID STREP SCREEN (MED CTR MEBANE ONLY): Streptococcus, Group A Screen (Direct): NEGATIVE

## 2016-12-11 MED ORDER — BENZONATATE 100 MG PO CAPS: 100.0000 mg | ORAL_CAPSULE | Freq: Three times a day (TID) | ORAL | 0 refills | Status: DC | PRN

## 2016-12-11 MED ORDER — ALBUTEROL SULFATE HFA 108 (90 BASE) MCG/ACT IN AERS
4.0000 | INHALATION_SPRAY | RESPIRATORY_TRACT | Status: AC
  Administered 2016-12-11: 4 via RESPIRATORY_TRACT
  Filled 2016-12-11: qty 6.7

## 2016-12-11 MED ORDER — ALBUTEROL SULFATE (2.5 MG/3ML) 0.083% IN NEBU: 2.5000 mg | INHALATION_SOLUTION | RESPIRATORY_TRACT | 0 refills | Status: DC | PRN

## 2016-12-11 MED ORDER — IPRATROPIUM-ALBUTEROL 0.5-2.5 (3) MG/3ML IN SOLN
3.0000 mL | Freq: Once | RESPIRATORY_TRACT | Status: AC
  Administered 2016-12-11: 3 mL via RESPIRATORY_TRACT
  Filled 2016-12-11: qty 3

## 2016-12-11 MED ORDER — CEPHALEXIN 500 MG PO CAPS: 500.0000 mg | ORAL_CAPSULE | Freq: Four times a day (QID) | ORAL | 0 refills | Status: DC

## 2016-12-11 NOTE — ED Provider Notes (Signed)
[facility]-EM[facility]Y DEPT Provider Note   CSN: [id number] Arrival date & time: 12/11/16  1201     History   Chief Complaint Chief Complaint  Patient presents with  . Emesis  . Cough    HPI Irene ShipperDorothy J Seiden is a 65 y.o. female.  HPI  Pt was seen at 1335.  Per pt, c/o gradual onset and persistence of constant sore throat, runny/stuffy nose, sinus congestion, and cough for the past 2 weeks. Has been associated with post-tussive emesis and several episodes of diarrhea.  Denies fevers, no rash, no CP/SOB, no abd pain.    Past Medical History:  Diagnosis Date  . Diabetes mellitus without complication (HCC)   . Hyperlipidemia   . Hypertension     Patient Active Problem List   Diagnosis Date Noted  . Diabetes (HCC) 06/29/2014  . Hyperlipidemia 06/29/2014  . Essential hypertension, benign 06/29/2014    Past Surgical History:  Procedure Laterality Date  . CARDIAC SURGERY     Triple  Bypass  . CESAREAN SECTION     two  . CORONARY ARTERY BYPASS GRAFT    . HERNIA REPAIR      OB History    No data available       Home Medications    Prior to Admission medications   Medication Sig Start Date End Date Taking? Authorizing Provider  benzonatate (TESSALON) 100 MG capsule Take by mouth 3 (three) times daily as needed for cough.    Historical Provider, MD  clopidogrel (PLAVIX) 75 MG tablet TAKE 1 TABLET (75 MG TOTAL) BY MOUTH DAILY. 08/16/16   Elige RadonJoshua A Dettinger, MD  doxycycline (VIBRAMYCIN) 100 MG capsule Take 100 mg by mouth 2 (two) times daily.    Historical Provider, MD  glimepiride (AMARYL) 2 MG tablet TAKE 1 TABLET (2 MG TOTAL) BY MOUTH DAILY WITH BREAKFAST. 11/12/16   Junie Spencerhristy A Hawks, FNP  HYDROcodone-homatropine (HYCODAN) 5-1.5 MG/5ML syrup Take 5 mLs by mouth every 8 (eight) hours as needed for cough. 09/26/16   Elige RadonJoshua A Dettinger, MD  isosorbide mononitrate (IMDUR) 30 MG 24 hr tablet Take 30 mg by mouth daily.    Historical Provider, MD  methylPREDNISolone (MEDROL DOSEPAK)  4 MG TBPK tablet Take by mouth.    Historical Provider, MD  metoprolol succinate (TOPROL-XL) 25 MG 24 hr tablet TAKE 1 TABLET (25 MG TOTAL) BY MOUTH DAILY. 11/12/16   Junie Spencerhristy A Hawks, FNP  nitroGLYCERIN (NITROSTAT) 0.4 MG SL tablet Place 0.4 mg under the tongue every 5 (five) minutes as needed for chest pain.    Historical Provider, MD  quinapril (ACCUPRIL) 10 MG tablet TAKE 1 TABLET (10 MG TOTAL) BY MOUTH DAILY. 10/23/16   Junie Spencerhristy A Hawks, FNP  rosuvastatin (CRESTOR) 10 MG tablet Take 1 tablet (10 mg total) by mouth at bedtime. 11/17/14   Frederica KusterStephen M Miller, MD  sitaGLIPtin (JANUVIA) 100 MG tablet Take 1 tablet (100 mg total) by mouth daily. Patient must be seen for future refills 08/20/16   Remus LofflerAngel S Jones, PA-C    Family History Family History  Problem Relation Age of Onset  . Diabetes Mother   . Diabetes Daughter   . Diabetes Son   . Diabetes Sister   . Diabetes Brother     Social History Social History  Substance Use Topics  . Smoking status: Current Every Day Smoker    Types: Cigarettes  . Smokeless tobacco: Never Used  . Alcohol use No     Allergies   Codeine   Review of  Systems Review of Systems ROS: Statement: All systems negative except as marked or noted in the HPI; Constitutional: Negative for fever and chills. ; ; Eyes: Negative for eye pain, redness and discharge. ; ; ENMT: Negative for ear pain, hoarseness, +nasal congestion, sinus pressure and sore throat. ; ; Cardiovascular: Negative for chest pain, palpitations, diaphoresis, dyspnea and peripheral edema. ; ; Respiratory: +cough, post-tussive emesis. Negative for wheezing and stridor. ; ; Gastrointestinal: +diarrhea. Negative for abdominal pain, blood in stool, hematemesis, jaundice and rectal bleeding. . ; ; Genitourinary: Negative for dysuria, flank pain and hematuria. ; ; Musculoskeletal: Negative for back pain and neck pain. Negative for swelling and trauma.; ; Skin: Negative for pruritus, rash, abrasions, blisters,  bruising and skin lesion.; ; Neuro: Negative for headache, lightheadedness and neck stiffness. Negative for weakness, altered level of consciousness, altered mental status, extremity weakness, paresthesias, involuntary movement, seizure and syncope.       Physical Exam Updated Vital Signs BP 159/63   Pulse 86   Temp 97.7 F (36.5 C) (Oral)   Resp 16   Ht 5\' 2"  (1.575 m)   Wt 180 lb (81.6 kg)   SpO2 98%   BMI 32.92 kg/m   Physical Exam 1340: Physical examination:  Nursing notes reviewed; Vital signs and O2 SAT reviewed;  Constitutional: Well developed, Well nourished, Well hydrated, In no acute distress; Head:  Normocephalic, atraumatic; Eyes: EOMI, PERRL, No scleral icterus; ENMT: TM's clear bilat. +edemetous nasal turbinates bilat with clear rhinorrhea. Mouth and pharynx without lesions. No tonsillar exudates. No intra-oral edema. No submandibular or sublingual edema. No hoarse voice, no drooling, no stridor. No pain with manipulation of larynx. No trismus. Mouth and pharynx normal, Mucous membranes moist; Neck: Supple, Full range of motion, No lymphadenopathy; Cardiovascular: Regular rate and rhythm, No gallop; Respiratory: Breath sounds coarse & equal bilaterally, scattered wheezes. No audible wheezing. Speaking full sentences with ease, Normal respiratory effort/excursion; Chest: Nontender, Movement normal; Abdomen: Soft, Nontender, Nondistended, Normal bowel sounds; Genitourinary: No CVA tenderness; Extremities: Pulses normal, No tenderness, No edema, No calf edema or asymmetry.; Neuro: AA&Ox3, Major CN grossly intact.  Speech clear. No gross focal motor or sensory deficits in extremities.; Skin: Color normal, Warm, Dry.   ED Treatments / Results  Labs (all labs ordered are listed, but only abnormal results are displayed)   EKG  EKG Interpretation None       Radiology   Procedures Procedures (including critical care time)  Medications Ordered in ED Medications    ipratropium-albuterol (DUONEB) 0.5-2.5 (3) MG/3ML nebulizer solution 3 mL (3 mLs Nebulization Given 12/11/16 1452)     Initial Impression / Assessment and Plan / ED Course  I have reviewed the triage vital signs and the nursing notes.  Pertinent labs & imaging results that were available during my care of the patient were reviewed by me and considered in my medical decision making (see chart for details).  MDM Reviewed: previous chart, nursing note and vitals Reviewed previous: labs Interpretation: labs and x-ray   Results for orders placed or performed during the hospital encounter of 12/11/16  Rapid strep screen  Result Value Ref Range   Streptococcus, Group A Screen (Direct) NEGATIVE NEGATIVE  Lipase, blood  Result Value Ref Range   Lipase 21 11 - 51 U/L  Comprehensive metabolic panel  Result Value Ref Range   Sodium 136 135 - 145 mmol/L   Potassium 3.7 3.5 - 5.1 mmol/L   Chloride 98 (L) 101 - 111 mmol/L   CO2  29 22 - 32 mmol/L   Glucose, Bld 241 (H) 65 - 99 mg/dL   BUN 19 6 - 20 mg/dL   Creatinine, Ser 4.090.78 0.44 - 1.00 mg/dL   Calcium 9.5 8.9 - 81.110.3 mg/dL   Total Protein 7.9 6.5 - 8.1 g/dL   Albumin 4.1 3.5 - 5.0 g/dL   AST 18 15 - 41 U/L   ALT 21 14 - 54 U/L   Alkaline Phosphatase 76 38 - 126 U/L   Total Bilirubin 0.7 0.3 - 1.2 mg/dL   GFR calc non Af Amer >60 >60 mL/min   GFR calc Af Amer >60 >60 mL/min   Anion gap 9 5 - 15  CBC  Result Value Ref Range   WBC 12.2 (H) 4.0 - 10.5 K/uL   RBC 4.45 3.87 - 5.11 MIL/uL   Hemoglobin 13.9 12.0 - 15.0 g/dL   HCT 91.442.7 78.236.0 - 95.646.0 %   MCV 96.0 78.0 - 100.0 fL   MCH 31.2 26.0 - 34.0 pg   MCHC 32.6 30.0 - 36.0 g/dL   RDW 21.313.2 08.611.5 - 57.815.5 %   Platelets 224 150 - 400 K/uL  Urinalysis, Routine w reflex microscopic  Result Value Ref Range   Color, Urine YELLOW YELLOW   APPearance HAZY (A) CLEAR   Specific Gravity, Urine 1.017 1.005 - 1.030   pH 6.0 5.0 - 8.0   Glucose, UA 150 (A) NEGATIVE mg/dL   Hgb urine dipstick  SMALL (A) NEGATIVE   Bilirubin Urine NEGATIVE NEGATIVE   Ketones, ur NEGATIVE NEGATIVE mg/dL   Protein, ur NEGATIVE NEGATIVE mg/dL   Nitrite POSITIVE (A) NEGATIVE   Leukocytes, UA TRACE (A) NEGATIVE   RBC / HPF 0-5 0 - 5 RBC/hpf   WBC, UA 0-5 0 - 5 WBC/hpf   Bacteria, UA FEW (A) NONE SEEN   Mucous PRESENT    Dg Chest 2 View Result Date: 12/11/2016 CLINICAL DATA:  Cough. EXAM: CHEST  2 VIEW COMPARISON:  09/21/2016.  09/17/2015. FINDINGS: Mediastinum and hilar structures are normal. Prior CABG. Heart size normal. No focal infiltrate. No pleural effusion or pneumothorax. IMPRESSION: 1. Prior CABG. 2.  No acute cardiopulmonary disease. Electronically Signed   By: Maisie Fushomas  Register   On: 12/11/2016 12:51   1615:  Feels better after neb; wheezing improved. No formal dx COPD; encouraged testing when improved and smoking cessation. Pt verb understanding. Will tx for URI and UTI (pt has access to neb, will rx soln). Pt wants to go home now. Dx and testing d/w pt and family.  Questions answered.  Verb understanding, agreeable to d/c home with outpt f/u.   Final Clinical Impressions(s) / ED Diagnoses   Final diagnoses:  None    New Prescriptions New Prescriptions   No medications on file     Samuel JesterKathleen Nikkia Devoss, DO 12/13/16 1857

## 2016-12-11 NOTE — ED Triage Notes (Signed)
Patient complains of nausea vomiting and cough x 1 week.

## 2016-12-11 NOTE — Discharge Instructions (Signed)
Take the prescriptions as directed.  Use your albuterol inhaler (2 to 4 puffs) or your albuterol nebulizer (1 unit dose) every 4 hours for the next 7 days, then as needed for cough, wheezing, or shortness of breath.  Call your regular medical doctor tomorrow morning to schedule a follow up appointment within the next 2 to 3 days.  Return to the Emergency Department immediately sooner if worsening.  ° °

## 2016-12-14 LAB — CULTURE, GROUP A STREP (THRC)

## 2016-12-18 ENCOUNTER — Other Ambulatory Visit: Payer: Self-pay | Admitting: Family

## 2016-12-20 DIAGNOSIS — Z23 Encounter for immunization: Secondary | ICD-10-CM | POA: Diagnosis not present

## 2016-12-29 ENCOUNTER — Other Ambulatory Visit: Payer: Self-pay | Admitting: Family Medicine

## 2017-01-12 ENCOUNTER — Other Ambulatory Visit: Payer: Self-pay | Admitting: Family Medicine

## 2017-01-20 ENCOUNTER — Ambulatory Visit (INDEPENDENT_AMBULATORY_CARE_PROVIDER_SITE_OTHER): Payer: Medicare Other | Admitting: Family

## 2017-01-20 ENCOUNTER — Encounter: Payer: Self-pay | Admitting: Family

## 2017-01-20 VITALS — BP 121/71 | HR 84 | Temp 98.1°F | Ht 62.0 in | Wt 187.2 lb

## 2017-01-20 DIAGNOSIS — E785 Hyperlipidemia, unspecified: Secondary | ICD-10-CM | POA: Diagnosis not present

## 2017-01-20 DIAGNOSIS — Z951 Presence of aortocoronary bypass graft: Secondary | ICD-10-CM

## 2017-01-20 DIAGNOSIS — E119 Type 2 diabetes mellitus without complications: Secondary | ICD-10-CM

## 2017-01-20 DIAGNOSIS — F17200 Nicotine dependence, unspecified, uncomplicated: Secondary | ICD-10-CM | POA: Insufficient documentation

## 2017-01-20 DIAGNOSIS — Z1159 Encounter for screening for other viral diseases: Secondary | ICD-10-CM | POA: Diagnosis not present

## 2017-01-20 DIAGNOSIS — I10 Essential (primary) hypertension: Secondary | ICD-10-CM | POA: Diagnosis not present

## 2017-01-20 LAB — BAYER DCA HB A1C WAIVED: HB A1C: 8.1 % — AB (ref <7.0)

## 2017-01-20 MED ORDER — CLOPIDOGREL BISULFATE 75 MG PO TABS: ORAL_TABLET | ORAL | 0 refills | Status: DC

## 2017-01-20 MED ORDER — SITAGLIPTIN PHOSPHATE 100 MG PO TABS: 100.0000 mg | ORAL_TABLET | Freq: Every day | ORAL | 1 refills | Status: DC

## 2017-01-20 MED ORDER — GLIMEPIRIDE 2 MG PO TABS: ORAL_TABLET | ORAL | 0 refills | Status: DC

## 2017-01-20 MED ORDER — CYCLOBENZAPRINE HCL 5 MG PO TABS: 5.0000 mg | ORAL_TABLET | Freq: Three times a day (TID) | ORAL | 2 refills | Status: DC | PRN

## 2017-01-20 MED ORDER — QUINAPRIL HCL 10 MG PO TABS: 10.0000 mg | ORAL_TABLET | Freq: Every day | ORAL | 1 refills | Status: DC

## 2017-01-20 MED ORDER — METOPROLOL SUCCINATE ER 25 MG PO TB24: ORAL_TABLET | ORAL | 0 refills | Status: DC

## 2017-01-20 MED ORDER — ROSUVASTATIN CALCIUM 10 MG PO TABS: 10.0000 mg | ORAL_TABLET | Freq: Every day | ORAL | 2 refills | Status: DC

## 2017-01-20 NOTE — Patient Instructions (Signed)
Mammogram Introduction A mammogram is an X-ray of the breasts that is done to check for changes that are not normal. This test can screen for and find any changes that may suggest breast cancer. This test can also help to find other changes and variations in the breast. What happens before the procedure?  Have this test done about 1-2 weeks after your period. This is usually when your breasts are the least tender.  If you are visiting a new doctor or clinic, send any past mammogram images to your new doctor's office.  Wash your breasts and under your arms the day of the test.  Do not use deodorants, perfumes, lotions, or powders on the day of the test.  Take off any jewelry from your neck.  Wear clothes that you can change into and out of easily. What happens during the procedure?  You will undress from the waist up. You will put on a gown.  You will stand in front of the X-ray machine.  Each breast will be placed between two plastic or glass plates. The plates will press down on your breast for a few seconds. Try to stay as relaxed as possible. This does not cause any harm to your breasts. Any discomfort you feel will be very brief.  X-rays will be taken from different angles of each breast. The procedure may vary among doctors and hospitals. What happens after the procedure?  The mammogram will be looked at by a specialist (radiologist).  You may need to do certain parts of the test again. This depends on the quality of the images.  Ask when your test results will be ready. Make sure you get your test results.  You may go back to your normal activities. This information is not intended to replace advice given to you by your health care provider. Make sure you discuss any questions you have with your health care provider. Document Released: 02/28/2009 Document Revised: 05/09/2016 Document Reviewed: 02/10/2015  2017 Elsevier  

## 2017-01-20 NOTE — Progress Notes (Signed)
Subjective:    Patient ID: Heidi Graham, female    DOB: 07/20/1951, 66 y.o.   MRN: [id number]  Pt presents to the office today for chronic follow up. PT had CABG in 2011. PT is followed by Cardiologists annually.  Diabetes  She presents for her follow-up diabetic visit. She has type 2 diabetes mellitus. Her disease course has been improving. Pertinent negatives for hypoglycemia include no confusion, dizziness or headaches. Pertinent negatives for diabetes include no blurred vision, no foot paresthesias, no foot ulcerations and no visual change. Pertinent negatives for hypoglycemia complications include no blackouts. Diabetic complications include heart disease. Pertinent negatives for diabetic complications include no CVA or peripheral neuropathy. Risk factors for coronary artery disease include dyslipidemia, diabetes mellitus, hypertension, post-menopausal and sedentary lifestyle. Current diabetic treatment includes oral agent (dual therapy). She is compliant with treatment all of the time. She is following a generally healthy diet. She participates in exercise daily. Her breakfast blood glucose range is generally 140-180 mg/dl. An ACE inhibitor/angiotensin II receptor blocker is being taken. Eye exam is not current.  Hyperlipidemia  This is a chronic problem. The current episode started more than 1 year ago. The problem is controlled. Recent lipid tests were reviewed and are normal. Exacerbating diseases include diabetes and obesity. She has no history of hypothyroidism. Pertinent negatives include no leg pain, myalgias or shortness of breath. Current antihyperlipidemic treatment includes statins. The current treatment provides significant improvement of lipids. Risk factors for coronary artery disease include dyslipidemia, diabetes mellitus, hypertension, obesity, post-menopausal and family history.  Hypertension  This is a chronic problem. The current episode started more than 1 year ago. The  problem has been resolved since onset. The problem is controlled. Pertinent negatives include no blurred vision, headaches, malaise/fatigue, palpitations, peripheral edema or shortness of breath. Risk factors for coronary artery disease include dyslipidemia, diabetes mellitus, family history, obesity and post-menopausal state. Past treatments include ACE inhibitors and beta blockers. The current treatment provides significant improvement. Hypertensive end-organ damage includes CAD/MI. There is no history of kidney disease, CVA or heart failure. There is no history of sleep apnea or a thyroid problem.      Review of Systems  Constitutional: Negative.  Negative for malaise/fatigue.  HENT: Negative.   Eyes: Negative.  Negative for blurred vision.  Respiratory: Negative.  Negative for shortness of breath.   Cardiovascular: Negative.  Negative for palpitations.  Gastrointestinal: Negative.   Endocrine: Negative.   Genitourinary: Negative.   Musculoskeletal: Negative.  Negative for myalgias.  Neurological: Negative.  Negative for dizziness and headaches.  Hematological: Negative.   Psychiatric/Behavioral: Negative.  Negative for confusion.  All other systems reviewed and are negative.      Objective:   Physical Exam  Constitutional: She is oriented to person, place, and time. She appears well-developed and well-nourished. No distress.  HENT:  Head: Normocephalic and atraumatic.  Right Ear: External ear normal.  Mouth/Throat: Oropharynx is clear and moist.  Eyes: Pupils are equal, round, and reactive to light.  Neck: Normal range of motion. Neck supple. No thyromegaly present.  Cardiovascular: Normal rate, regular rhythm, normal heart sounds and intact distal pulses.   No murmur heard. Pulmonary/Chest: Effort normal and breath sounds normal. No respiratory distress. She has no wheezes.  Abdominal: Soft. Bowel sounds are normal. She exhibits no distension. There is no tenderness.    Musculoskeletal: Normal range of motion. She exhibits no edema or tenderness.  Neurological: She is alert and oriented to person, place, and time. She  has normal reflexes. No cranial nerve deficit.  Skin: Skin is warm and dry.  Psychiatric: She has a normal mood and affect. Her behavior is normal. Judgment and thought content normal.  Vitals reviewed.  Diabetic Foot Exam - Simple   Simple Foot Form Diabetic Foot exam was performed with the following findings:  Yes 01/20/2017 10:00 AM  Visual Inspection No deformities, no ulcerations, no other skin breakdown bilaterally:  Yes Sensation Testing Intact to touch and monofilament testing bilaterally:  Yes Pulse Check Posterior Tibialis and Dorsalis pulse intact bilaterally:  Yes Comments Toenail long and thick        BP 121/71   Pulse 84   Temp 98.1 F (36.7 C) (Oral)   Ht _0  (1.575 m)   Wt 187 lb 3.2 oz (84.9 kg)   BMI 34.24 kg/m      Assessment & Plan:  1. Essential hypertension, benign - CMP14+EGFR - metoprolol succinate (TOPROL-XL) 25 MG 24 hr tablet; TAKE 1 TABLET (25 MG TOTAL) BY MOUTH DAILY.  Dispense: 90 tablet; Refill: 0 - quinapril (ACCUPRIL) 10 MG tablet; Take 1 tablet (10 mg total) by mouth at bedtime. TAKE 1 TABLET (10 MG TOTAL) BY MOUTH DAILY.  Dispense: 90 tablet; Refill: 1  2. Type 2 diabetes mellitus without complication, without long-term current use of insulin (HCC) - CMP14+EGFR - Microalbumin / creatinine urine ratio - Bayer DCA Hb A1c Waived - Ambulatory referral to Ophthalmology - glimepiride (AMARYL) 2 MG tablet; TAKE 1 TABLET (2 MG TOTAL) BY MOUTH DAILY WITH BREAKFAST.  Dispense: 90 tablet; Refill: 0 - sitaGLIPtin (JANUVIA) 100 MG tablet; Take 1 tablet (100 mg total) by mouth daily. Patient must be seen for future refills  Dispense: 90 tablet; Refill: 1 - quinapril (ACCUPRIL) 10 MG tablet; Take 1 tablet (10 mg total) by mouth at bedtime. TAKE 1 TABLET (10 MG TOTAL) BY MOUTH DAILY.  Dispense: 90  tablet; Refill: 1  3. Hyperlipidemia, unspecified hyperlipidemia type  - CMP14+EGFR - Lipid panel - rosuvastatin (CRESTOR) 10 MG tablet; Take 1 tablet (10 mg total) by mouth at bedtime.  Dispense: 90 tablet; Refill: 2  4. Current smoker - CMP14+EGFR  5. Hx of CABG  - CMP14+EGFR - clopidogrel (PLAVIX) 75 MG tablet; TAKE 1 TABLET (75 MG TOTAL) BY MOUTH DAILY.  Dispense: 90 tablet; Refill: 0  6. Need for hepatitis C screening test - CMP14+EGFR - Hepatitis C antibody   Continue all meds Labs pending Health Maintenance reviewed Diet and exercise encouraged RTO 4 months   Evelina Dun, FNP

## 2017-01-21 ENCOUNTER — Encounter: Payer: Self-pay | Admitting: Pharmacist

## 2017-01-21 ENCOUNTER — Ambulatory Visit (INDEPENDENT_AMBULATORY_CARE_PROVIDER_SITE_OTHER): Payer: Medicare Other | Admitting: Pharmacist

## 2017-01-21 VITALS — BP 138/78 | HR 76 | Ht 63.0 in | Wt 187.0 lb

## 2017-01-21 DIAGNOSIS — Z780 Asymptomatic menopausal state: Secondary | ICD-10-CM

## 2017-01-21 DIAGNOSIS — Z0000 Encounter for general adult medical examination without abnormal findings: Secondary | ICD-10-CM | POA: Diagnosis not present

## 2017-01-21 DIAGNOSIS — E119 Type 2 diabetes mellitus without complications: Secondary | ICD-10-CM | POA: Diagnosis not present

## 2017-01-21 LAB — CMP14+EGFR
ALK PHOS: 70 IU/L (ref 39–117)
ALT: 18 IU/L (ref 0–32)
AST: 18 IU/L (ref 0–40)
Albumin/Globulin Ratio: 1.4 (ref 1.2–2.2)
Albumin: 4.1 g/dL (ref 3.6–4.8)
BILIRUBIN TOTAL: 0.5 mg/dL (ref 0.0–1.2)
BUN / CREAT RATIO: 21 (ref 12–28)
BUN: 14 mg/dL (ref 8–27)
CHLORIDE: 101 mmol/L (ref 96–106)
CO2: 24 mmol/L (ref 18–29)
CREATININE: 0.68 mg/dL (ref 0.57–1.00)
Calcium: 9.4 mg/dL (ref 8.7–10.3)
GFR calc Af Amer: 106 mL/min/{1.73_m2} (ref >59)
GFR calc non Af Amer: 92 mL/min/{1.73_m2} (ref >59)
GLUCOSE: 185 mg/dL — AB (ref 65–99)
Globulin, Total: 2.9 g/dL (ref 1.5–4.5)
Potassium: 4.7 mmol/L (ref 3.5–5.2)
Sodium: 142 mmol/L (ref 134–144)
Total Protein: 7 g/dL (ref 6.0–8.5)

## 2017-01-21 LAB — LIPID PANEL
CHOLESTEROL TOTAL: 162 mg/dL (ref 100–199)
Chol/HDL Ratio: 4.4 ratio units (ref 0.0–4.4)
HDL: 37 mg/dL — AB (ref >39)
LDL Calculated: 94 mg/dL (ref 0–99)
TRIGLYCERIDES: 155 mg/dL — AB (ref 0–149)
VLDL CHOLESTEROL CAL: 31 mg/dL (ref 5–40)

## 2017-01-21 LAB — HEPATITIS C ANTIBODY: Hep C Virus Ab: 0.3 s/co ratio (ref 0.0–0.9)

## 2017-01-21 MED ORDER — METFORMIN HCL ER 500 MG PO TB24: 500.0000 mg | ORAL_TABLET | Freq: Every day | ORAL | 1 refills | Status: DC

## 2017-01-21 NOTE — Patient Instructions (Addendum)
Heidi Graham , Thank you for taking time to come for your Medicare Wellness Visit. I appreciate your ongoing commitment to your health goals. Please review the following plan we discussed and let me know if I can assist you in the future.   These are the goals we discussed:  Fill out paperwork for Advanced Directives - call office if you have questions  Schedule yearly eye exam - [facility] [phone] or Dr Marin Comment [facility]) [phone]  Start metformin ER 583m - take 1 tablet daily with largest meal  Continue to walk daily - goal is to exercise 150 minutes every week  Increase non-starchy vegetables - carrots, green bean, squash, zucchini, tomatoes, onions, peppers, spinach and other green leafy vegetables, cabbage, lettuce, cucumbers, asparagus, okra (not fried), eggplant Limit sugar and processed foods (cakes, cookies, ice cream, crackers and chips) Increase fresh fruit but limit serving sizes 1/2 cup or about the size of tennis or baseball Limit red meat to no more than 1-2 times per week (serving size about the size of your palm) Choose whole grains / lean proteins - whole wheat bread, quinoa, whole grain rice (1/2 cup), fish, chicken, t[place]void sugar and calorie containing beverages - soda, sweet tea and juice.  Choose water or unsweetened tea instead.    This is a list of the screening recommended for you and due dates:  Health Maintenance  Topic Date Due  . Eye exam for diabetics  03/20/2015 - needed  . Mammogram  01/30/2016  - needed, scheduled for 02/24/17  . Pap Smear  07/20/2017*  . DEXA scan (bone density measurement)  08/20/2017 - sent referral today  . Hemoglobin A1C  07/20/2017  . Pneumonia vaccines (2 of 2 - PPSV23) 11/19/2017  . Complete foot exam   01/20/2018  . Tetanus Vaccine  06/29/2020  . Colon Cancer Screening  06/29/2022  . Flu Shot  Completed  . Shingles Vaccine  Addressed  .  Hepatitis C: One time screening is recommended by Center for Disease Control   (CDC) for  adults born from [birth year]through 1965.   Completed  . HIV Screening  Addressed  *Topic was postponed. The date shown is not the original due date.   Diabetes and Standards of Medical Care   Diabetes is complicated. You may find that your diabetes team includes a dietitian, nurse, diabetes educator, eye doctor, and more. To help everyone know what is going on and to help you get the care you deserve, the following schedule of care was developed to help keep you on track. Below are the tests, exams, vaccines, medicines, education, and plans you will need.  Blood Glucose Goals Prior to meals = 80 - 130 Within 2 hours of the start of a meal = less than 180  HbA1c test (goal is less than 7.0% - your last value was 8.1 %) This test shows how well you have controlled your glucose over the past 2 to 3 months. It is used to see if your diabetes management plan needs to be adjusted.   It is performed at least 2 times a year if you are meeting treatment goals.  It is performed 4 times a year if therapy has changed or if you are not meeting treatment goals.  Blood pressure test  This test is performed at every routine medical visit. The goal is less than 140/90 mmHg for most people, but 130/80 mmHg in some cases. Ask your health care provider about your goal.  Dental  exam  Follow up with the dentist regularly.  Eye exam  If you are diagnosed with type 1 diabetes as a child, get an exam upon reaching the age of 63 years or older and have had diabetes for 3 to 5 years. Yearly eye exams are recommended after that initial eye exam.  If you are diagnosed with type 1 diabetes as an adult, get an exam within 5 years of diagnosis and then yearly.  If you are diagnosed with type 2 diabetes, get an exam as soon as possible after the diagnosis and then yearly.  Foot care exam  Visual foot exams are performed at every routine medical visit. The exams check for cuts, injuries, or other problems  with the feet.  A comprehensive foot exam should be done yearly. This includes visual inspection as well as assessing foot pulses and testing for loss of sensation.  Check your feet nightly for cuts, injuries, or other problems with your feet. Tell your health care provider if anything is not healing.  Kidney function test (urine microalbumin)  This test is performed once a year.  Type 1 diabetes: The first test is performed 5 years after diagnosis.  Type 2 diabetes: The first test is performed at the time of diagnosis.  A serum creatinine and estimated glomerular filtration rate (eGFR) test is done once a year to assess the level of chronic kidney disease (CKD), if present.  Lipid profile (cholesterol, HDL, LDL, triglycerides)  Performed every 5 years for most people.  The goal for LDL is less than 100 mg/dL. If you are at high risk, the goal is less than 70 mg/dL.  The goal for HDL is 40 mg/dL to 50 mg/dL for men and 50 mg/dL to 60 mg/dL for women. An HDL cholesterol of 60 mg/dL or higher gives some protection against heart disease.  The goal for triglycerides is less than 150 mg/dL.  Influenza vaccine, pneumococcal vaccine, and hepatitis B vaccine  The influenza vaccine is recommended yearly.  The pneumococcal vaccine is generally given once in a lifetime. However, there are some instances when another vaccination is recommended. Check with your health care provider.  The hepatitis B vaccine is also recommended for adults with diabetes.  Diabetes self-management education  Education is recommended at diagnosis and ongoing as needed.  Treatment plan  Your treatment plan is reviewed at every medical visit.  Document Released: 09/29/2009 Document Revised: 08/04/2013 Document Reviewed: 05/04/2013 [facility] Patient Information 2014 Impact.  Not

## 2017-01-21 NOTE — Progress Notes (Signed)
Patient ID: Heidi Graham, female   DOB: 1951-01-18, 66 y.o.   MRN: [id number]     Subjective:   Heidi Graham is a 66 y.o. female who presents for an Initial Medicare Annual Wellness Visit.  Social History: Born/Raised: From [place]; moved here to [place], [place] for about 2 years because son lives here Occupational history: worked at [employer] Marital history: widowed - 8 years;  Adult daughter lives with patient 4 children - adults Alcohol/Tobacco/Substances: yes - smokes   Current Medications (verified) Outpatient Encounter Prescriptions as of 01/21/2017  Medication Sig  . albuterol (PROVENTIL) (2.5 MG/3ML) 0.083% nebulizer solution Take 3 mLs (2.5 mg total) by nebulization every 4 (four) hours as needed for wheezing or shortness of breath.  . clopidogrel (PLAVIX) 75 MG tablet TAKE 1 TABLET (75 MG TOTAL) BY MOUTH DAILY.  . cyclobenzaprine (FLEXERIL) 5 MG tablet Take 1 tablet (5 mg total) by mouth 3 (three) times daily as needed for muscle spasms.  Marland Kitchen glimepiride (AMARYL) 2 MG tablet TAKE 1 TABLET (2 MG TOTAL) BY MOUTH DAILY WITH BREAKFAST.  Marland Kitchen isosorbide mononitrate (IMDUR) 30 MG 24 hr tablet Take 30 mg by mouth daily.  . metoprolol succinate (TOPROL-XL) 25 MG 24 hr tablet TAKE 1 TABLET (25 MG TOTAL) BY MOUTH DAILY.  . nitroGLYCERIN (NITROSTAT) 0.4 MG SL tablet Place 0.4 mg under the tongue every 5 (five) minutes as needed for chest pain.  Marland Kitchen quinapril (ACCUPRIL) 10 MG tablet Take 1 tablet (10 mg total) by mouth at bedtime. TAKE 1 TABLET (10 MG TOTAL) BY MOUTH DAILY.  . rosuvastatin (CRESTOR) 10 MG tablet Take 1 tablet (10 mg total) by mouth at bedtime.  . sitaGLIPtin (JANUVIA) 100 MG tablet Take 1 tablet (100 mg total) by mouth daily. Patient must be seen for future refills   No facility-administered encounter medications on file as of 01/21/2017.     Allergies (verified) Codeine   History: Past Medical History:  Diagnosis Date  . Arthritis   . Cataract   .  Diabetes mellitus without complication (HCC)   . Hyperlipidemia   . Hypertension   . Myocardial infarction 2011   Past Surgical History:  Procedure Laterality Date  . CARDIAC SURGERY     Triple  Bypass  . CESAREAN SECTION     two  . CORONARY ARTERY BYPASS GRAFT    . HERNIA REPAIR     umbilical   Family History  Problem Relation Age of Onset  . Diabetes Mother   . Hypertension Mother   . Diabetes Daughter   . Diabetes Son   . Diabetes Sister     type 2  . Hypertension Sister   . Diabetes Brother     type 1   Social History   Occupational History  . Not on file.   Social History Main Topics  . Smoking status: Current Every Day Smoker    Packs/day: 0.10    Years: 6.00    Types: Cigarettes  . Smokeless tobacco: Never Used     Comment: smoked for 20 years - quit for 9 years and restarted about 6 yeasr ago.  . Alcohol use No  . Drug use: No  . Sexual activity: No    Do you feel safe at home?  Yes Are there smokers in your home (other than you)? No  Dietary issues and exercise activities: Current Exercise Habits: Home exercise routine, Type of exercise: walking, Time (Minutes): 20, Frequency (Times/Week): 3, Weekly Exercise (Minutes/Week): 60,  Intensity: Moderate  Current Dietary habits:  Not following any specific dietary recommendations at this time  Objective:    Today's Vitals   01/21/17 1056  BP: (!) 150/78  Pulse: 76  Weight: 187 lb (84.8 kg)  Height: 5\' 3"  (1.6 m)  PainSc: 0-No pain   Body mass index is 33.13 kg/m.  Activities of Daily Living In your present state of health, do you have any difficulty performing the following activities: 01/21/2017  Hearing? Y  Vision? N  Difficulty concentrating or making decisions? N  Walking or climbing stairs? N  Dressing or bathing? N  Doing errands, shopping? N  Preparing Food and eating ? N  Using the Toilet? N  In the past six months, have you accidently leaked urine? N  Do you have problems with loss  of bowel control? N  Managing your Medications? N  Managing your Finances? N  Housekeeping or managing your Housekeeping? N  Some recent data might be hidden     Cardiac Risk Factors include: advanced age (>9men, >47 women);diabetes mellitus;dyslipidemia;hypertension;obesity (BMI >30kg/m2);smoking/ tobacco exposure  Depression Screen PHQ 2/9 Scores 01/21/2017 01/20/2017 03/20/2015 11/17/2014  PHQ - 2 Score 1 1 0 5  PHQ- 9 Score - - - 5     Fall Risk Fall Risk  01/21/2017 01/20/2017 03/20/2015 11/17/2014  Falls in the past year? No No No No    Cognitive Function: No flowsheet data found.  Immunizations and Health Maintenance Immunization History  Administered Date(s) Administered  . Influenza,inj,Quad PF,36+ Mos 11/17/2014, 10/19/2015   Health Maintenance Due  Topic Date Due  . OPHTHALMOLOGY EXAM  03/20/2015  . MAMMOGRAM  01/30/2016    Patient Care Team: Junie Spencer, FNP as PCP - General (Nurse Practitioner) Delsa Sale, MD as Referring Physician (Internal Medicine) - cardiology  Indicate any recent Medical Services you may have received from other than [facility] providers in the past year (date may be approximate).    Assessment:    Annual Wellness Visit    Screening Tests Health Maintenance  Topic Date Due  . OPHTHALMOLOGY EXAM  03/20/2015  . MAMMOGRAM  01/30/2016  . PAP SMEAR  07/20/2017 (Originally 01/16/2017)  . DEXA SCAN  08/20/2017 (Originally 02/22/2016)  . HEMOGLOBIN A1C  07/20/2017  . PNA vac Low Risk Adult (2 of 2 - PPSV23) 11/19/2017  . FOOT EXAM  01/20/2018  . TETANUS/TDAP  06/29/2020  . COLONOSCOPY  06/29/2022  . INFLUENZA VACCINE  Completed  . ZOSTAVAX  Addressed  . Hepatitis C Screening  Completed  . HIV Screening  Addressed        Plan:   During the course of the visit Heidi Graham was educated and counseled about the following appropriate screening and preventive services:   Vaccines to include Pneumoccal, Influenza, Td - UTD;  Patient has declined  shingle vaccine currently  Colorectal cancer screening - UTD  Cardiovascular disease screening - last ECHO and EKG 10/2015  Diabetes - started metformin XR 500mg  take 1 tablet with largest meal  Bone Denisty / Osteoporosis screening - sent order for DEXA (will do same day as mammogram)  Mammogram - scheduled  Glaucoma screening / Diabetic Eye Exam - reminded patient that eye exam is needed and given phone numbers for local optometrist  Nutrition counseling - discussed CHO counting diet for better BG control.  Advanced Directives - information discussed and packet provided  Physical Activity - continue daily walking  No orders of the defined types were placed in this encounter.  Patient Instructions (the written plan) were given to the patient.   Henrene Pastorammy Shannan Slinker, PharmD   01/21/2017

## 2017-01-22 LAB — MICROALBUMIN / CREATININE URINE RATIO
CREATININE, UR: 84.2 mg/dL
MICROALB/CREAT RATIO: 48.5 mg/g{creat} — AB (ref 0.0–30.0)
MICROALBUM., U, RANDOM: 40.8 ug/mL

## 2017-01-23 ENCOUNTER — Telehealth: Payer: Self-pay | Admitting: Family

## 2017-01-23 ENCOUNTER — Other Ambulatory Visit: Payer: Self-pay | Admitting: Family

## 2017-01-23 MED ORDER — METFORMIN HCL ER 750 MG PO TB24: 1500.0000 mg | ORAL_TABLET | Freq: Every day | ORAL | 1 refills | Status: DC

## 2017-01-23 NOTE — Telephone Encounter (Signed)
Pt aware of results 

## 2017-01-28 ENCOUNTER — Ambulatory Visit: Payer: Medicare Other | Admitting: Pharmacist

## 2017-02-17 LAB — HM DIABETES EYE EXAM

## 2017-02-24 ENCOUNTER — Encounter: Payer: Self-pay | Admitting: *Deleted

## 2017-02-24 ENCOUNTER — Other Ambulatory Visit: Payer: Medicare Other

## 2017-03-14 DIAGNOSIS — R197 Diarrhea, unspecified: Secondary | ICD-10-CM | POA: Diagnosis not present

## 2017-03-14 DIAGNOSIS — R112 Nausea with vomiting, unspecified: Secondary | ICD-10-CM | POA: Diagnosis not present

## 2017-03-14 DIAGNOSIS — Z7982 Long term (current) use of aspirin: Secondary | ICD-10-CM | POA: Diagnosis not present

## 2017-03-14 DIAGNOSIS — F17200 Nicotine dependence, unspecified, uncomplicated: Secondary | ICD-10-CM | POA: Diagnosis not present

## 2017-03-14 DIAGNOSIS — I252 Old myocardial infarction: Secondary | ICD-10-CM | POA: Diagnosis not present

## 2017-03-14 DIAGNOSIS — R05 Cough: Secondary | ICD-10-CM | POA: Diagnosis not present

## 2017-03-14 DIAGNOSIS — Z7902 Long term (current) use of antithrombotics/antiplatelets: Secondary | ICD-10-CM | POA: Diagnosis not present

## 2017-03-14 DIAGNOSIS — Z79899 Other long term (current) drug therapy: Secondary | ICD-10-CM | POA: Diagnosis not present

## 2017-03-14 DIAGNOSIS — I2510 Atherosclerotic heart disease of native coronary artery without angina pectoris: Secondary | ICD-10-CM | POA: Diagnosis not present

## 2017-03-14 DIAGNOSIS — E119 Type 2 diabetes mellitus without complications: Secondary | ICD-10-CM | POA: Diagnosis not present

## 2017-03-14 DIAGNOSIS — I10 Essential (primary) hypertension: Secondary | ICD-10-CM | POA: Diagnosis not present

## 2017-03-14 DIAGNOSIS — Z951 Presence of aortocoronary bypass graft: Secondary | ICD-10-CM | POA: Diagnosis not present

## 2017-03-14 DIAGNOSIS — J441 Chronic obstructive pulmonary disease with (acute) exacerbation: Secondary | ICD-10-CM | POA: Diagnosis not present

## 2017-03-25 ENCOUNTER — Other Ambulatory Visit: Payer: Self-pay | Admitting: Family

## 2017-04-17 ENCOUNTER — Other Ambulatory Visit: Payer: Self-pay | Admitting: Family

## 2017-04-17 DIAGNOSIS — Z951 Presence of aortocoronary bypass graft: Secondary | ICD-10-CM

## 2017-04-22 ENCOUNTER — Ambulatory Visit: Payer: Self-pay | Admitting: Family

## 2017-05-10 DIAGNOSIS — Z7982 Long term (current) use of aspirin: Secondary | ICD-10-CM | POA: Diagnosis not present

## 2017-05-10 DIAGNOSIS — R05 Cough: Secondary | ICD-10-CM | POA: Diagnosis not present

## 2017-05-10 DIAGNOSIS — Z7902 Long term (current) use of antithrombotics/antiplatelets: Secondary | ICD-10-CM | POA: Diagnosis not present

## 2017-05-10 DIAGNOSIS — J209 Acute bronchitis, unspecified: Secondary | ICD-10-CM | POA: Diagnosis not present

## 2017-05-10 DIAGNOSIS — Z79899 Other long term (current) drug therapy: Secondary | ICD-10-CM | POA: Diagnosis not present

## 2017-05-10 DIAGNOSIS — F17200 Nicotine dependence, unspecified, uncomplicated: Secondary | ICD-10-CM | POA: Diagnosis not present

## 2017-05-10 DIAGNOSIS — Z7984 Long term (current) use of oral hypoglycemic drugs: Secondary | ICD-10-CM | POA: Diagnosis not present

## 2017-05-10 DIAGNOSIS — I252 Old myocardial infarction: Secondary | ICD-10-CM | POA: Diagnosis not present

## 2017-05-10 DIAGNOSIS — E119 Type 2 diabetes mellitus without complications: Secondary | ICD-10-CM | POA: Diagnosis not present

## 2017-05-10 DIAGNOSIS — Z951 Presence of aortocoronary bypass graft: Secondary | ICD-10-CM | POA: Diagnosis not present

## 2017-05-10 DIAGNOSIS — I10 Essential (primary) hypertension: Secondary | ICD-10-CM | POA: Diagnosis not present

## 2017-05-10 DIAGNOSIS — I2510 Atherosclerotic heart disease of native coronary artery without angina pectoris: Secondary | ICD-10-CM | POA: Diagnosis not present

## 2017-05-17 ENCOUNTER — Other Ambulatory Visit: Payer: Self-pay | Admitting: Family

## 2017-05-17 DIAGNOSIS — Z7902 Long term (current) use of antithrombotics/antiplatelets: Secondary | ICD-10-CM | POA: Diagnosis not present

## 2017-05-17 DIAGNOSIS — Z7952 Long term (current) use of systemic steroids: Secondary | ICD-10-CM | POA: Diagnosis not present

## 2017-05-17 DIAGNOSIS — R0789 Other chest pain: Secondary | ICD-10-CM | POA: Diagnosis not present

## 2017-05-17 DIAGNOSIS — I2510 Atherosclerotic heart disease of native coronary artery without angina pectoris: Secondary | ICD-10-CM | POA: Diagnosis not present

## 2017-05-17 DIAGNOSIS — R05 Cough: Secondary | ICD-10-CM | POA: Diagnosis not present

## 2017-05-17 DIAGNOSIS — F17200 Nicotine dependence, unspecified, uncomplicated: Secondary | ICD-10-CM | POA: Diagnosis not present

## 2017-05-17 DIAGNOSIS — I10 Essential (primary) hypertension: Secondary | ICD-10-CM

## 2017-05-17 DIAGNOSIS — I252 Old myocardial infarction: Secondary | ICD-10-CM | POA: Diagnosis not present

## 2017-05-17 DIAGNOSIS — E119 Type 2 diabetes mellitus without complications: Secondary | ICD-10-CM

## 2017-05-17 DIAGNOSIS — Z7982 Long term (current) use of aspirin: Secondary | ICD-10-CM | POA: Diagnosis not present

## 2017-05-17 DIAGNOSIS — Z79899 Other long term (current) drug therapy: Secondary | ICD-10-CM | POA: Diagnosis not present

## 2017-05-17 DIAGNOSIS — J209 Acute bronchitis, unspecified: Secondary | ICD-10-CM | POA: Diagnosis not present

## 2017-05-17 DIAGNOSIS — R079 Chest pain, unspecified: Secondary | ICD-10-CM | POA: Diagnosis not present

## 2017-05-17 DIAGNOSIS — Z951 Presence of aortocoronary bypass graft: Secondary | ICD-10-CM | POA: Diagnosis not present

## 2017-05-17 DIAGNOSIS — Z7984 Long term (current) use of oral hypoglycemic drugs: Secondary | ICD-10-CM | POA: Diagnosis not present

## 2017-07-31 ENCOUNTER — Other Ambulatory Visit: Payer: Self-pay | Admitting: Family

## 2017-07-31 DIAGNOSIS — Z951 Presence of aortocoronary bypass graft: Secondary | ICD-10-CM

## 2017-08-01 NOTE — Telephone Encounter (Signed)
Last seen 01/20/17  Heidi Graham 

## 2017-08-14 DIAGNOSIS — R931 Abnormal findings on diagnostic imaging of heart and coronary circulation: Secondary | ICD-10-CM | POA: Diagnosis not present

## 2017-08-14 DIAGNOSIS — E669 Obesity, unspecified: Secondary | ICD-10-CM | POA: Diagnosis not present

## 2017-08-14 DIAGNOSIS — I200 Unstable angina: Secondary | ICD-10-CM | POA: Diagnosis not present

## 2017-08-14 DIAGNOSIS — E1169 Type 2 diabetes mellitus with other specified complication: Secondary | ICD-10-CM | POA: Diagnosis not present

## 2017-08-14 DIAGNOSIS — Z1231 Encounter for screening mammogram for malignant neoplasm of breast: Secondary | ICD-10-CM | POA: Diagnosis not present

## 2017-08-14 DIAGNOSIS — I10 Essential (primary) hypertension: Secondary | ICD-10-CM | POA: Diagnosis not present

## 2017-08-14 DIAGNOSIS — E782 Mixed hyperlipidemia: Secondary | ICD-10-CM | POA: Diagnosis not present

## 2017-08-15 ENCOUNTER — Other Ambulatory Visit: Payer: Self-pay | Admitting: Family

## 2017-08-15 DIAGNOSIS — I10 Essential (primary) hypertension: Secondary | ICD-10-CM

## 2017-08-15 DIAGNOSIS — E119 Type 2 diabetes mellitus without complications: Secondary | ICD-10-CM

## 2017-08-19 NOTE — Telephone Encounter (Signed)
Last [facility] 01/20/17  [facility]

## 2017-09-11 ENCOUNTER — Other Ambulatory Visit: Payer: Self-pay | Admitting: Family

## 2017-09-11 DIAGNOSIS — E119 Type 2 diabetes mellitus without complications: Secondary | ICD-10-CM

## 2017-09-11 DIAGNOSIS — I10 Essential (primary) hypertension: Secondary | ICD-10-CM

## 2017-09-11 NOTE — Telephone Encounter (Signed)
Patient NTBS for follow up and lab work  

## 2017-09-11 NOTE — Telephone Encounter (Signed)
Last seen 01/20/17  Heidi Graham 

## 2017-09-29 ENCOUNTER — Other Ambulatory Visit: Payer: Self-pay | Admitting: Family

## 2017-09-29 DIAGNOSIS — E119 Type 2 diabetes mellitus without complications: Secondary | ICD-10-CM

## 2017-10-07 ENCOUNTER — Other Ambulatory Visit: Payer: Self-pay | Admitting: Family

## 2017-10-07 DIAGNOSIS — E785 Hyperlipidemia, unspecified: Secondary | ICD-10-CM

## 2017-10-07 NOTE — Telephone Encounter (Signed)
Last lipid  01/20/17  Heidi Graham

## 2017-10-08 DIAGNOSIS — Z23 Encounter for immunization: Secondary | ICD-10-CM | POA: Diagnosis not present

## 2017-10-15 ENCOUNTER — Other Ambulatory Visit: Payer: Self-pay | Admitting: Family

## 2017-10-15 DIAGNOSIS — I10 Essential (primary) hypertension: Secondary | ICD-10-CM

## 2017-10-15 DIAGNOSIS — E119 Type 2 diabetes mellitus without complications: Secondary | ICD-10-CM

## 2017-10-21 ENCOUNTER — Encounter: Payer: Self-pay | Admitting: Family

## 2017-10-21 ENCOUNTER — Ambulatory Visit (INDEPENDENT_AMBULATORY_CARE_PROVIDER_SITE_OTHER): Payer: Medicare Other | Admitting: Family

## 2017-10-21 VITALS — BP 137/63 | HR 79 | Temp 97.7°F | Ht 63.0 in | Wt 179.9 lb

## 2017-10-21 DIAGNOSIS — E1159 Type 2 diabetes mellitus with other circulatory complications: Secondary | ICD-10-CM | POA: Diagnosis not present

## 2017-10-21 DIAGNOSIS — E1165 Type 2 diabetes mellitus with hyperglycemia: Secondary | ICD-10-CM | POA: Diagnosis not present

## 2017-10-21 DIAGNOSIS — E1169 Type 2 diabetes mellitus with other specified complication: Secondary | ICD-10-CM

## 2017-10-21 DIAGNOSIS — F17200 Nicotine dependence, unspecified, uncomplicated: Secondary | ICD-10-CM | POA: Diagnosis not present

## 2017-10-21 DIAGNOSIS — E785 Hyperlipidemia, unspecified: Secondary | ICD-10-CM

## 2017-10-21 DIAGNOSIS — Z951 Presence of aortocoronary bypass graft: Secondary | ICD-10-CM | POA: Diagnosis not present

## 2017-10-21 DIAGNOSIS — I10 Essential (primary) hypertension: Secondary | ICD-10-CM

## 2017-10-21 LAB — CMP14+EGFR
A/G RATIO: 1.7 (ref 1.2–2.2)
ALK PHOS: 71 IU/L (ref 39–117)
ALT: 9 IU/L (ref 0–32)
AST: 17 IU/L (ref 0–40)
Albumin: 4.3 g/dL (ref 3.6–4.8)
BUN/Creatinine Ratio: 19 (ref 12–28)
BUN: 14 mg/dL (ref 8–27)
Bilirubin Total: 0.3 mg/dL (ref 0.0–1.2)
CALCIUM: 9.4 mg/dL (ref 8.7–10.3)
CHLORIDE: 105 mmol/L (ref 96–106)
CO2: 25 mmol/L (ref 20–29)
CREATININE: 0.72 mg/dL (ref 0.57–1.00)
GFR calc Af Amer: 101 mL/min/{1.73_m2} (ref >59)
GFR calc non Af Amer: 88 mL/min/{1.73_m2} (ref >59)
GLOBULIN, TOTAL: 2.5 g/dL (ref 1.5–4.5)
Glucose: 102 mg/dL — ABNORMAL HIGH (ref 65–99)
Potassium: 4.4 mmol/L (ref 3.5–5.2)
SODIUM: 143 mmol/L (ref 134–144)
Total Protein: 6.8 g/dL (ref 6.0–8.5)

## 2017-10-21 LAB — LIPID PANEL
Chol/HDL Ratio: 4 ratio (ref 0.0–4.4)
Cholesterol, Total: 151 mg/dL (ref 100–199)
HDL: 38 mg/dL — ABNORMAL LOW
LDL Calculated: 87 mg/dL (ref 0–99)
Triglycerides: 131 mg/dL (ref 0–149)
VLDL Cholesterol Cal: 26 mg/dL (ref 5–40)

## 2017-10-21 LAB — BAYER DCA HB A1C WAIVED: HB A1C: 6.9 % (ref <7.0)

## 2017-10-21 NOTE — Patient Instructions (Signed)
Diabetes Mellitus and Food It is important for you to manage your blood sugar (glucose) level. Your blood glucose level can be greatly affected by what you eat. Eating healthier foods in the appropriate amounts throughout the day at about the same time each day will help you control your blood glucose level. It can also help slow or prevent worsening of your diabetes mellitus. Healthy eating may even help you improve the level of your blood pressure and reach or maintain a healthy weight. General recommendations for healthful eating and cooking habits include:  Eating meals and snacks regularly. Avoid going long periods of time without eating to lose weight.  Eating a diet that consists mainly of plant-based foods, such as fruits, vegetables, nuts, legumes, and whole grains.  Using low-heat cooking methods, such as baking, instead of high-heat cooking methods, such as deep frying.  Work with your dietitian to make sure you understand how to use the Nutrition Facts information on food labels. How can food affect me? Carbohydrates Carbohydrates affect your blood glucose level more than any other type of food. Your dietitian will help you determine how many carbohydrates to eat at each meal and teach you how to count carbohydrates. Counting carbohydrates is important to keep your blood glucose at a healthy level, especially if you are using insulin or taking certain medicines for diabetes mellitus. Alcohol Alcohol can cause sudden decreases in blood glucose (hypoglycemia), especially if you use insulin or take certain medicines for diabetes mellitus. Hypoglycemia can be a life-threatening condition. Symptoms of hypoglycemia (sleepiness, dizziness, and disorientation) are similar to symptoms of having too much alcohol. If your health care provider has given you approval to drink alcohol, do so in moderation and use the following guidelines:  Women should not have more than one drink per day, and men  should not have more than two drinks per day. One drink is equal to: ? 12 oz of beer. ? 5 oz of wine. ? 1 oz of hard liquor.  Do not drink on an empty stomach.  Keep yourself hydrated. Have water, diet soda, or unsweetened iced tea.  Regular soda, juice, and other mixers might contain a lot of carbohydrates and should be counted.  What foods are not recommended? As you make food choices, it is important to remember that all foods are not the same. Some foods have fewer nutrients per serving than other foods, even though they might have the same number of calories or carbohydrates. It is difficult to get your body what it needs when you eat foods with fewer nutrients. Examples of foods that you should avoid that are high in calories and carbohydrates but low in nutrients include:  Trans fats (most processed foods list trans fats on the Nutrition Facts label).  Regular soda.  Juice.  Candy.  Sweets, such as cake, pie, doughnuts, and cookies.  Fried foods.  What foods can I eat? Eat nutrient-rich foods, which will nourish your body and keep you healthy. The food you should eat also will depend on several factors, including:  The calories you need.  The medicines you take.  Your weight.  Your blood glucose level.  Your blood pressure level.  Your cholesterol level.  You should eat a variety of foods, including:  Protein. ? Lean cuts of meat. ? Proteins low in saturated fats, such as fish, egg whites, and beans. Avoid processed meats.  Fruits and vegetables. ? Fruits and vegetables that may help control blood glucose levels, such as apples,   mangoes, and yams.  Dairy products. ? Choose fat-free or low-fat dairy products, such as milk, yogurt, and cheese.  Grains, bread, pasta, and rice. ? Choose whole grain products, such as multigrain bread, whole oats, and brown rice. These foods may help control blood pressure.  Fats. ? Foods containing healthful fats, such as  nuts, avocado, olive oil, canola oil, and fish.  Does everyone with diabetes mellitus have the same meal plan? Because every person with diabetes mellitus is different, there is not one meal plan that works for everyone. It is very important that you meet with a dietitian who will help you create a meal plan that is just right for you. This information is not intended to replace advice given to you by your health care provider. Make sure you discuss any questions you have with your health care provider. Document Released: 08/29/2005 Document Revised: 05/09/2016 Document Reviewed: 10/29/2013 Elsevier Interactive Patient Education  2017 [employer]  

## 2017-10-21 NOTE — Progress Notes (Signed)
Subjective:    Patient ID: Heidi Graham, female    DOB: [birth year], 66 y.o.   MRN: [id number]  PT presents to the office today for chronic follow up. PT is followed by Cardiologists annually for hx CABG.  Diabetes  She presents for her follow-up diabetic visit. She has type 2 diabetes mellitus. Her disease course has been stable. There are no hypoglycemic associated symptoms. Pertinent negatives for hypoglycemia include no confusion or hunger. There are no diabetic associated symptoms. Pertinent negatives for diabetes include no blurred vision, no foot paresthesias and no visual change. There are no hypoglycemic complications. Symptoms are stable. Diabetic complications include heart disease. Pertinent negatives for diabetic complications include no CVA, nephropathy or peripheral neuropathy. Risk factors for coronary artery disease include diabetes mellitus, dyslipidemia, family history, obesity, female sex, hypertension and sedentary lifestyle. She is following a diabetic diet. Her breakfast blood glucose range is generally 110-130 mg/dl. Eye exam is current.  Hyperlipidemia  This is a chronic problem. The current episode started more than 1 year ago. The problem is uncontrolled. Recent lipid tests were reviewed and are high. Exacerbating diseases include obesity. Pertinent negatives include no shortness of breath. Current antihyperlipidemic treatment includes statins. The current treatment provides moderate improvement of lipids. Risk factors for coronary artery disease include diabetes mellitus, dyslipidemia, obesity, hypertension, post-menopausal, a sedentary lifestyle and family history.  Hypertension  This is a chronic problem. The current episode started more than 1 year ago. The problem has been resolved since onset. The problem is controlled. Pertinent negatives include no blurred vision, malaise/fatigue, peripheral edema or shortness of breath. Risk factors for coronary artery disease include  diabetes mellitus, dyslipidemia, family history, post-menopausal state, obesity and sedentary lifestyle. The current treatment provides moderate improvement. Hypertensive end-organ damage includes CAD/MI. There is no history of CVA.      Review of Systems  Constitutional: Negative for malaise/fatigue.  Eyes: Negative for blurred vision.  Respiratory: Negative for shortness of breath.   Psychiatric/Behavioral: Negative for confusion.  All other systems reviewed and are negative.      Objective:   Physical Exam  Constitutional: She is oriented to person, place, and time. She appears well-developed and well-nourished. No distress.  HENT:  Head: Normocephalic and atraumatic.  Right Ear: External ear normal.  Left Ear: External ear normal.  Nose: Nose normal.  Mouth/Throat: Oropharynx is clear and moist.  Eyes: Pupils are equal, round, and reactive to light.  Neck: Normal range of motion. Neck supple. No thyromegaly present.  Cardiovascular: Normal rate, regular rhythm, normal heart sounds and intact distal pulses.  No murmur heard. Pulmonary/Chest: Effort normal and breath sounds normal. No respiratory distress. She has no wheezes.  Abdominal: Soft. Bowel sounds are normal. She exhibits no distension. There is no tenderness.  Musculoskeletal: Normal range of motion. She exhibits no edema or tenderness.  Neurological: She is alert and oriented to person, place, and time.  Skin: Skin is warm and dry.  Psychiatric: She has a normal mood and affect. Her behavior is normal. Judgment and thought content normal.  Vitals reviewed.     BP 137/63   Pulse 79   Temp 97.7 F (36.5 C) (Oral)   Ht '5\' 3"'$  (1.6 m)   Wt 179 lb 14.4 oz (81.6 kg)   BMI 31.87 kg/m      Assessment & Plan:  1. Type 2 diabetes mellitus with hyperglycemia, without long-term current use of insulin (HCC) - Bayer DCA Hb A1c Waived - CMP14+EGFR  2.  Hyperlipidemia associated with type 2 diabetes mellitus (HCC) -  CMP14+EGFR - Lipid panel  3. Hypertension associated with diabetes ([place])  - CMP14+EGFR  4. Current smoker - CMP14+EGFR  5. Hx of CABG  - CMP14+EGFR   Continue all meds Labs pending Health Maintenance reviewed Diet and exercise encouraged RTO 3 months   Evelina Dun, FNP

## 2017-10-27 ENCOUNTER — Encounter: Payer: Self-pay | Admitting: *Deleted

## 2017-10-29 ENCOUNTER — Other Ambulatory Visit: Payer: Self-pay | Admitting: Family Medicine

## 2017-11-03 ENCOUNTER — Other Ambulatory Visit: Payer: Self-pay | Admitting: Family

## 2017-11-03 DIAGNOSIS — E119 Type 2 diabetes mellitus without complications: Secondary | ICD-10-CM

## 2017-11-11 ENCOUNTER — Other Ambulatory Visit: Payer: Self-pay | Admitting: *Deleted

## 2017-11-11 DIAGNOSIS — E119 Type 2 diabetes mellitus without complications: Secondary | ICD-10-CM

## 2017-11-11 DIAGNOSIS — I10 Essential (primary) hypertension: Secondary | ICD-10-CM

## 2017-11-11 MED ORDER — GLIMEPIRIDE 2 MG PO TABS: ORAL_TABLET | ORAL | 0 refills | Status: DC

## 2017-11-11 MED ORDER — METOPROLOL SUCCINATE ER 25 MG PO TB24: ORAL_TABLET | ORAL | 0 refills | Status: DC

## 2017-11-11 NOTE — Addendum Note (Signed)
Addended by: Julious PayerHOLT, CATHERINE D on: 11/11/2017 04:09 PM   Modules accepted: Orders

## 2017-12-11 ENCOUNTER — Other Ambulatory Visit: Payer: Self-pay | Admitting: *Deleted

## 2017-12-11 MED ORDER — METFORMIN HCL ER 750 MG PO TB24: 1500.0000 mg | ORAL_TABLET | Freq: Every day | ORAL | 1 refills | Status: DC

## 2017-12-14 ENCOUNTER — Other Ambulatory Visit: Payer: Self-pay | Admitting: Family

## 2017-12-19 ENCOUNTER — Other Ambulatory Visit: Payer: Self-pay

## 2017-12-19 DIAGNOSIS — E119 Type 2 diabetes mellitus without complications: Secondary | ICD-10-CM

## 2017-12-19 DIAGNOSIS — I10 Essential (primary) hypertension: Secondary | ICD-10-CM

## 2017-12-19 MED ORDER — QUINAPRIL HCL 10 MG PO TABS: 10.0000 mg | ORAL_TABLET | Freq: Every day | ORAL | 0 refills | Status: DC

## 2017-12-30 ENCOUNTER — Other Ambulatory Visit: Payer: Self-pay

## 2017-12-30 DIAGNOSIS — E785 Hyperlipidemia, unspecified: Secondary | ICD-10-CM

## 2017-12-30 MED ORDER — ROSUVASTATIN CALCIUM 10 MG PO TABS: 10.0000 mg | ORAL_TABLET | Freq: Every day | ORAL | 0 refills | Status: DC

## 2018-01-13 DIAGNOSIS — Z1231 Encounter for screening mammogram for malignant neoplasm of breast: Secondary | ICD-10-CM | POA: Diagnosis not present

## 2018-02-02 ENCOUNTER — Other Ambulatory Visit: Payer: Self-pay | Admitting: Family

## 2018-02-10 ENCOUNTER — Other Ambulatory Visit: Payer: Self-pay | Admitting: *Deleted

## 2018-02-10 DIAGNOSIS — E119 Type 2 diabetes mellitus without complications: Secondary | ICD-10-CM

## 2018-02-10 MED ORDER — SITAGLIPTIN PHOSPHATE 100 MG PO TABS: ORAL_TABLET | ORAL | 0 refills | Status: DC

## 2018-02-10 MED ORDER — GLIMEPIRIDE 2 MG PO TABS: ORAL_TABLET | ORAL | 0 refills | Status: DC

## 2018-02-25 DIAGNOSIS — F17200 Nicotine dependence, unspecified, uncomplicated: Secondary | ICD-10-CM | POA: Diagnosis not present

## 2018-02-25 DIAGNOSIS — Z7902 Long term (current) use of antithrombotics/antiplatelets: Secondary | ICD-10-CM | POA: Diagnosis not present

## 2018-02-25 DIAGNOSIS — Z7982 Long term (current) use of aspirin: Secondary | ICD-10-CM | POA: Diagnosis not present

## 2018-02-25 DIAGNOSIS — Z79899 Other long term (current) drug therapy: Secondary | ICD-10-CM | POA: Diagnosis not present

## 2018-02-25 DIAGNOSIS — I2510 Atherosclerotic heart disease of native coronary artery without angina pectoris: Secondary | ICD-10-CM | POA: Diagnosis not present

## 2018-02-25 DIAGNOSIS — I252 Old myocardial infarction: Secondary | ICD-10-CM | POA: Diagnosis not present

## 2018-02-25 DIAGNOSIS — I10 Essential (primary) hypertension: Secondary | ICD-10-CM | POA: Diagnosis not present

## 2018-02-25 DIAGNOSIS — R05 Cough: Secondary | ICD-10-CM | POA: Diagnosis not present

## 2018-02-25 DIAGNOSIS — Z720 Tobacco use: Secondary | ICD-10-CM | POA: Diagnosis not present

## 2018-02-25 DIAGNOSIS — Z7984 Long term (current) use of oral hypoglycemic drugs: Secondary | ICD-10-CM | POA: Diagnosis not present

## 2018-02-25 DIAGNOSIS — E119 Type 2 diabetes mellitus without complications: Secondary | ICD-10-CM | POA: Diagnosis not present

## 2018-02-25 DIAGNOSIS — J40 Bronchitis, not specified as acute or chronic: Secondary | ICD-10-CM | POA: Diagnosis not present

## 2018-03-11 ENCOUNTER — Other Ambulatory Visit: Payer: Self-pay | Admitting: Family

## 2018-03-11 DIAGNOSIS — I10 Essential (primary) hypertension: Secondary | ICD-10-CM

## 2018-03-14 ENCOUNTER — Other Ambulatory Visit: Payer: Self-pay | Admitting: Family

## 2018-03-14 DIAGNOSIS — E119 Type 2 diabetes mellitus without complications: Secondary | ICD-10-CM

## 2018-03-14 DIAGNOSIS — I10 Essential (primary) hypertension: Secondary | ICD-10-CM

## 2018-05-07 ENCOUNTER — Other Ambulatory Visit: Payer: Self-pay | Admitting: Family

## 2018-05-07 DIAGNOSIS — E119 Type 2 diabetes mellitus without complications: Secondary | ICD-10-CM

## 2018-06-11 ENCOUNTER — Other Ambulatory Visit: Payer: Self-pay | Admitting: Family

## 2018-06-12 ENCOUNTER — Other Ambulatory Visit: Payer: Self-pay | Admitting: Family

## 2018-06-12 DIAGNOSIS — E119 Type 2 diabetes mellitus without complications: Secondary | ICD-10-CM

## 2018-06-12 DIAGNOSIS — I10 Essential (primary) hypertension: Secondary | ICD-10-CM

## 2018-06-16 ENCOUNTER — Other Ambulatory Visit: Payer: Self-pay | Admitting: Family

## 2018-06-16 DIAGNOSIS — E119 Type 2 diabetes mellitus without complications: Secondary | ICD-10-CM

## 2018-06-16 DIAGNOSIS — I10 Essential (primary) hypertension: Secondary | ICD-10-CM

## 2018-06-27 ENCOUNTER — Other Ambulatory Visit: Payer: Self-pay | Admitting: Family

## 2018-06-27 DIAGNOSIS — E785 Hyperlipidemia, unspecified: Secondary | ICD-10-CM

## 2018-06-30 ENCOUNTER — Telehealth: Payer: Self-pay | Admitting: Family

## 2018-06-30 DIAGNOSIS — I10 Essential (primary) hypertension: Secondary | ICD-10-CM

## 2018-06-30 DIAGNOSIS — E785 Hyperlipidemia, unspecified: Secondary | ICD-10-CM

## 2018-06-30 DIAGNOSIS — E119 Type 2 diabetes mellitus without complications: Secondary | ICD-10-CM

## 2018-06-30 MED ORDER — ISOSORBIDE MONONITRATE ER 30 MG PO TB24: 30.0000 mg | ORAL_TABLET | Freq: Every day | ORAL | 0 refills | Status: DC

## 2018-06-30 MED ORDER — ROSUVASTATIN CALCIUM 10 MG PO TABS: 10.0000 mg | ORAL_TABLET | Freq: Every day | ORAL | 0 refills | Status: DC

## 2018-06-30 MED ORDER — CLOPIDOGREL BISULFATE 75 MG PO TABS: 75.0000 mg | ORAL_TABLET | Freq: Every day | ORAL | 0 refills | Status: DC

## 2018-06-30 MED ORDER — METOPROLOL SUCCINATE ER 25 MG PO TB24: ORAL_TABLET | ORAL | 0 refills | Status: DC

## 2018-06-30 MED ORDER — QUINAPRIL HCL 10 MG PO TABS: 10.0000 mg | ORAL_TABLET | Freq: Every day | ORAL | 0 refills | Status: DC

## 2018-06-30 MED ORDER — METFORMIN HCL ER 750 MG PO TB24: 1500.0000 mg | ORAL_TABLET | Freq: Every day | ORAL | 1 refills | Status: DC

## 2018-06-30 MED ORDER — SITAGLIPTIN PHOSPHATE 100 MG PO TABS: ORAL_TABLET | ORAL | 0 refills | Status: DC

## 2018-06-30 MED ORDER — GLIMEPIRIDE 2 MG PO TABS: 2.0000 mg | ORAL_TABLET | Freq: Every day | ORAL | 0 refills | Status: DC

## 2018-06-30 NOTE — Telephone Encounter (Signed)
Prescriptions sent to pharmacy

## 2018-06-30 NOTE — Telephone Encounter (Signed)
Patient notified that meds sent to pharmacy 

## 2018-06-30 NOTE — Telephone Encounter (Signed)
Please review and advise.

## 2018-08-18 ENCOUNTER — Encounter: Payer: Self-pay | Admitting: *Deleted

## 2018-08-18 ENCOUNTER — Ambulatory Visit (INDEPENDENT_AMBULATORY_CARE_PROVIDER_SITE_OTHER): Payer: Medicare Other | Admitting: *Deleted

## 2018-08-18 VITALS — BP 130/64 | HR 77 | Ht 63.0 in | Wt 179.0 lb

## 2018-08-18 DIAGNOSIS — Z23 Encounter for immunization: Secondary | ICD-10-CM | POA: Diagnosis not present

## 2018-08-18 DIAGNOSIS — Z0000 Encounter for general adult medical examination without abnormal findings: Secondary | ICD-10-CM

## 2018-08-18 NOTE — Progress Notes (Signed)
Subjective:   Heidi Graham is a 67 y.o. female who presents for Medicare Annual (Subsequent) preventive examination.  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  She enjoys working in her yard and around her home.  She has a young grand daughter and great grandson live with her.  She has 4 children, 1 is deceased.  She has 4 grand children and 1 great grandson .  She feels her health has improved this year from last because because she feels better in general.  Heidi Graham reports no hospitalizations, ER visits, or surgeries in the past year.   Review of Systems:  All negative today       Objective:     Vitals: BP 130/64   Pulse 77   Ht 5\' 3"  (1.6 m)   Wt 179 lb (81.2 kg)   BMI 31.71 kg/m   Body mass index is 31.71 kg/m.  Advanced Directives 08/18/2018 01/21/2017 12/11/2016  Does Patient Have a Medical Advance Directive? No No No  Would patient like information on creating a medical advance directive? Yes (MAU/Ambulatory/Procedural Areas - Information given) Yes (MAU/Ambulatory/Procedural Areas - Information given) No - Patient declined    Tobacco Social History   Tobacco Use  Smoking Status Current Every Day Smoker  . Packs/day: 0.30  . Years: 28.00  . Pack years: 8.40  . Types: Cigarettes  Smokeless Tobacco Never Used  Tobacco Comment   smoked for 20 years - quit for 9 years and restarted about 6 yeasr ago.     Ready to quit: Not Answered Counseling given: Not Answered Comment: smoked for 20 years - quit for 9 years and restarted about 6 yeasr ago.   Clinical Intake:     Pain Score: 0-No pain                 Past Medical History:  Diagnosis Date  . Arthritis   . Cataract   . Diabetes mellitus without complication (HCC)   . Hyperlipidemia   . Hypertension   . Myocardial infarction [facility]) 2011   Past Surgical History:  Procedure Laterality Date  . CARDIAC SURGERY     Triple   Bypass  . CESAREAN SECTION     two  . CORONARY ARTERY BYPASS GRAFT    . HERNIA REPAIR     umbilical   Family History  Problem Relation Age of Onset  . Diabetes Mother   . Hypertension Mother   . Diabetes Daughter   . Diabetes Son   . Diabetes Sister        type 2  . Hypertension Sister   . Diabetes Brother        type 1   Social History   Socioeconomic History  . Marital status: Widowed    Spouse name: Not on file  . Number of children: Not on file  . Years of education: Not on file  . Highest education level: Not on file  Occupational History  . Not on file  Social Needs  . Financial resource strain: Not on file  . Food insecurity:    Worry: Not on file    Inability: Not on file  . Transportation needs:    Medical: Not on file    Non-medical: Not on file  Tobacco Use  . Smoking status: Current Every Day Smoker    Packs/day: 0.30    Years: 28.00  Pack years: 8.40    Types: Cigarettes  . Smokeless tobacco: Never Used  . Tobacco comment: smoked for 20 years - quit for 9 years and restarted about 6 yeasr ago.  Substance and Sexual Activity  . Alcohol use: No  . Drug use: No  . Sexual activity: Never  Lifestyle  . Physical activity:    Days per week: Not on file    Minutes per session: Not on file  . Stress: Not on file  Relationships  . Social connections:    Talks on phone: Not on file    Gets together: Not on file    Attends religious service: Not on file    Active member of club or organization: Not on file    Attends meetings of clubs or organizations: Not on file    Relationship status: Not on file  Other Topics Concern  . Not on file  Social History Narrative  . Not on file    Outpatient Encounter Medications as of 08/18/2018  Medication Sig  . albuterol (PROVENTIL) (2.5 MG/3ML) 0.083% nebulizer solution Take 3 mLs (2.5 mg total) by nebulization every 4 (four) hours as needed for wheezing or shortness of breath.  . clopidogrel (PLAVIX) 75 MG  tablet Take 1 tablet (75 mg total) by mouth daily.  . cyclobenzaprine (FLEXERIL) 5 MG tablet TAKE 1 TABLET (5 MG TOTAL) BY MOUTH 3 (THREE) TIMES DAILY AS NEEDED FOR MUSCLE SPASMS.  Marland Kitchen glimepiride (AMARYL) 2 MG tablet Take 1 tablet (2 mg total) by mouth daily with breakfast.  . isosorbide mononitrate (IMDUR) 30 MG 24 hr tablet Take 1 tablet (30 mg total) by mouth daily.  . metFORMIN (GLUCOPHAGE XR) 750 MG 24 hr tablet Take 2 tablets (1,500 mg total) by mouth daily with breakfast.  . metoprolol succinate (TOPROL-XL) 25 MG 24 hr tablet TAKE 1 TABLET BY MOUTH EVERY DAY  . Multiple Vitamin (THERA) TABS Take by mouth.  . nitroGLYCERIN (NITROSTAT) 0.4 MG SL tablet Place 0.4 mg under the tongue every 5 (five) minutes as needed for chest pain.  Marland Kitchen quinapril (ACCUPRIL) 10 MG tablet Take 1 tablet (10 mg total) by mouth at bedtime. TAKE 1 TABLET (10 MG TOTAL) BY MOUTH DAILY.  . rosuvastatin (CRESTOR) 10 MG tablet Take 1 tablet (10 mg total) by mouth at bedtime.  . sitaGLIPtin (JANUVIA) 100 MG tablet TAKE 1 TABLET BY MOUTH EVERY DAY PATIENT MUST BE SEEN   No facility-administered encounter medications on file as of 08/18/2018.     Activities of Daily Living In your present state of health, do you have any difficulty performing the following activities: 08/18/2018  Hearing? N  Vision? Y  Comment Vision has declined since last eye exam.  Patient due for eye exam now.  Will call to schedule  Difficulty concentrating or making decisions? N  Walking or climbing stairs? N  Dressing or bathing? N  Doing errands, shopping? N  Preparing Food and eating ? N  Using the Toilet? N  In the past six months, have you accidently leaked urine? N  Do you have problems with loss of bowel control? N  Managing your Medications? N  Managing your Finances? N  Housekeeping or managing your Housekeeping? N  Some recent data might be hidden    Patient Care Team: Junie Spencer, FNP as PCP - General (Nurse Practitioner) Alene Mires, Carollee Herter, MD as Referring Physician (Internal Medicine)    Assessment:   This is a routine wellness examination for Heidi Graham.  Exercise Activities and Dietary recommendations  Patient states she usually eats 3 meals per day.  Usually 2 hard boiled eggs and 1 piece of sausage for breakfast, and a salad for lunch and supper.  She states that she does not enjoy eating meat very much, but does get plenty of servings of vegetables.  Recommended diet of mostly lean proteins, vegetables, and fruits and whole grains in moderation.   Current Exercise Habits: Home exercise routine, Type of exercise: walking, Time (Minutes): 60, Frequency (Times/Week): 7, Weekly Exercise (Minutes/Week): 420, Intensity: Mild  Goals    . Quit Smoking     Pick quit date and work on reducing cigarettes until you have quit smoking.       Fall Risk Fall Risk  08/18/2018 10/21/2017 01/21/2017 01/20/2017 03/20/2015  Falls in the past year? No No No No No   Is the patient's home free of loose throw rugs in walkways, pet beds, electrical cords, etc?   yes      Grab bars in the bathroom? yes      Handrails on the stairs?   yes      Adequate lighting?   yes    Depression Screen PHQ 2/9 Scores 08/18/2018 10/21/2017 01/21/2017 01/20/2017  PHQ - 2 Score 0 0 1 1  PHQ- 9 Score - - - -     Cognitive Function MMSE - Mini Mental State Exam 08/18/2018 01/21/2017  Orientation to time 5 5  Orientation to Place 5 4  Registration 3 3  Attention/ Calculation 5 5  Recall 3 2  Language- name 2 objects 2 2  Language- repeat 1 1  Language- follow 3 step command 3 3  Language- read & follow direction 1 1  Write a sentence 1 1  Copy design 1 1  Total score 30 28        Immunization History  Administered Date(s) Administered  . Influenza, High Dose Seasonal PF 10/08/2017  . Influenza, Seasonal, Injecte, Preservative Fre 11/17/2014  . Influenza,inj,Quad PF,6+ Mos 11/17/2014, 10/19/2015  . Influenza-Unspecified 10/15/2017  .  Pneumococcal Polysaccharide-23 08/18/2018    Qualifies for Shingles Vaccine? Declined today  Screening Tests Health Maintenance  Topic Date Due  . FOOT EXAM  01/20/2018  . OPHTHALMOLOGY EXAM  02/17/2018  . HEMOGLOBIN A1C  04/20/2018  . INFLUENZA VACCINE  07/16/2018  . DEXA SCAN  08/19/2019 (Originally 02/22/2016)  . MAMMOGRAM  01/14/2020  . TETANUS/TDAP  06/29/2020  . COLONOSCOPY  06/29/2022  . Hepatitis C Screening  Completed  . PNA vac Low Risk Adult  Completed   Patient is scheduled for eye exam 08/25/18 at 9:20 am. Recommend foot exam, hemoglobin A1c, and influenza vaccine at visit with Jannifer Rodney, FNP on 09/28/18.    Cancer Screenings: Lung: Low Dose CT Chest recommended if Age 15-80 years, 30 pack-year currently smoking OR have quit w/in 15years. Patient does qualify. Breast:  Up to date on Mammogram? Yes   Up to date of Bone Density/Dexa? No - patient declined today Colorectal: up to date  Additional Screenings:  Hepatitis C Screening: completed 01/20/17     Plan:   Work on goal of quitting smoking.  Review the information given on Advance Directives.  If you complete the paper work please bring a copy to our office to be filed in your chart. Attend eye exam at [facility] in [place], [place] on 08/25/18 at 9:20 am.   I have personally reviewed and noted the following in the patient's chart:   .  Medical and social history . Use of alcohol, tobacco or illicit drugs  . Current medications and supplements . Functional ability and status . Nutritional status . Physical activity . Advanced directives . List of other physicians . Hospitalizations, surgeries, and ER visits in previous 12 months . Vitals . Screenings to include cognitive, depression, and falls . Referrals and appointments  In addition, I have reviewed and discussed with patient certain preventive protocols, quality metrics, and best practice recommendations. A written personalized care plan for  preventive services as well as general preventive health recommendations were provided to patient.     WYATT, AMY M, RN  08/18/2018  I have reviewed and agree with the above AWV documentation.   Jannifer Rodney, FNP

## 2018-08-18 NOTE — Patient Instructions (Addendum)
Please work on your goal of quitting smoking.  There are medications that can help you with stopping smoking.  If you are interested in trying one of these medications please discuss with Evelina Dun, FNP at next visit.   Please review the information given on Advance Directives.  If you complete the paper work please bring a copy to our office to be filed in your chart.   You have been scheduled for your eye exam at [facility] in [place], [place] on 08/25/18 at 9:20 am.  Thank you for coming in for your Annual Wellness Visit today!   Preventive Care 67 Years and Older, Female Preventive care refers to lifestyle choices and visits with your health care provider that can promote health and wellness. What does preventive care include?  A yearly physical exam. This is also called an annual well check.  Dental exams once or twice a year.  Routine eye exams. Ask your health care provider how often you should have your eyes checked.  Personal lifestyle choices, including: ? Daily care of your teeth and gums. ? Regular physical activity. ? Eating a healthy diet. ? Avoiding tobacco and drug use. ? Limiting alcohol use. ? Practicing safe sex. ? Taking low-dose aspirin every day. ? Taking vitamin and mineral supplements as recommended by your health care provider. What happens during an annual well check? The services and screenings done by your health care provider during your annual well check will depend on your age, overall health, lifestyle risk factors, and family history of disease. Counseling Your health care provider may ask you questions about your:  Alcohol use.  Tobacco use.  Drug use.  Emotional well-being.  Home and relationship well-being.  Sexual activity.  Eating habits.  History of falls.  Memory and ability to understand (cognition).  Work and work Statistician.  Reproductive health.  Screening You may have the following tests or measurements:  Height,  weight, and BMI.  Blood pressure.  Lipid and cholesterol levels. These may be checked every 5 years, or more frequently if you are over 67 years old.  Skin check.  Lung cancer screening. You may have this screening every year starting at age 87 if you have a 30-pack-year history of smoking and currently smoke or have quit within the past 15 years.  Fecal occult blood test (FOBT) of the stool. You may have this test every year starting at age 67.  Flexible sigmoidoscopy or colonoscopy. You may have a sigmoidoscopy every 5 years or a colonoscopy every 10 years starting at age 67.  Hepatitis C blood test.  Hepatitis B blood test.  Sexually transmitted disease (STD) testing.  Diabetes screening. This is done by checking your blood sugar (glucose) after you have not eaten for a while (fasting). You may have this done every 1-3 years.  Bone density scan. This is done to screen for osteoporosis. You may have this done starting at age 67.  Mammogram. This may be done every 1-2 years. Talk to your health care provider about how often you should have regular mammograms.  Talk with your health care provider about your test results, treatment options, and if necessary, the need for more tests. Vaccines Your health care provider may recommend certain vaccines, such as:  Influenza vaccine. This is recommended every year.  Tetanus, diphtheria, and acellular pertussis (Tdap, Td) vaccine. You may need a Td booster every 10 years.  Varicella vaccine. You may need this if you have not been vaccinated.  Zoster vaccine. You may need this after age 71.  Measles, mumps, and rubella (MMR) vaccine. You may need at least one dose of MMR if you were born in 1957 or later. You may also need a second dose.  Pneumococcal 13-valent conjugate (PCV13) vaccine. One dose is recommended after age 67.  Pneumococcal polysaccharide (PPSV23) vaccine. One dose is recommended after age 67.  Meningococcal vaccine.  You may need this if you have certain conditions.  Hepatitis A vaccine. You may need this if you have certain conditions or if you travel or work in places where you may be exposed to hepatitis A.  Hepatitis B vaccine. You may need this if you have certain conditions or if you travel or work in places where you may be exposed to hepatitis B.  Haemophilus influenzae type b (Hib) vaccine. You may need this if you have certain conditions.  Talk to your health care provider about which screenings and vaccines you need and how often you need them. This information is not intended to replace advice given to you by your health care provider. Make sure you discuss any questions you have with your health care provider. Document Released: 12/29/2015 Document Revised: 08/21/2016 Document Reviewed: 10/03/2015 Elsevier Interactive Patient Education  [employer].

## 2018-09-26 ENCOUNTER — Other Ambulatory Visit: Payer: Self-pay | Admitting: Family

## 2018-09-26 DIAGNOSIS — E119 Type 2 diabetes mellitus without complications: Secondary | ICD-10-CM

## 2018-09-26 DIAGNOSIS — I10 Essential (primary) hypertension: Secondary | ICD-10-CM

## 2018-09-27 ENCOUNTER — Other Ambulatory Visit: Payer: Self-pay | Admitting: Family

## 2018-09-27 DIAGNOSIS — I10 Essential (primary) hypertension: Secondary | ICD-10-CM

## 2018-09-27 DIAGNOSIS — E785 Hyperlipidemia, unspecified: Secondary | ICD-10-CM

## 2018-09-28 ENCOUNTER — Encounter: Payer: Self-pay | Admitting: Family

## 2018-09-28 ENCOUNTER — Ambulatory Visit (INDEPENDENT_AMBULATORY_CARE_PROVIDER_SITE_OTHER): Payer: Medicare Other | Admitting: Family

## 2018-09-28 VITALS — BP 136/64 | HR 78 | Temp 98.4°F | Ht 63.0 in | Wt 177.6 lb

## 2018-09-28 DIAGNOSIS — Z951 Presence of aortocoronary bypass graft: Secondary | ICD-10-CM

## 2018-09-28 DIAGNOSIS — E1165 Type 2 diabetes mellitus with hyperglycemia: Secondary | ICD-10-CM

## 2018-09-28 DIAGNOSIS — E1159 Type 2 diabetes mellitus with other circulatory complications: Secondary | ICD-10-CM | POA: Diagnosis not present

## 2018-09-28 DIAGNOSIS — F17200 Nicotine dependence, unspecified, uncomplicated: Secondary | ICD-10-CM | POA: Diagnosis not present

## 2018-09-28 DIAGNOSIS — J019 Acute sinusitis, unspecified: Secondary | ICD-10-CM

## 2018-09-28 DIAGNOSIS — I10 Essential (primary) hypertension: Secondary | ICD-10-CM

## 2018-09-28 DIAGNOSIS — E1169 Type 2 diabetes mellitus with other specified complication: Secondary | ICD-10-CM

## 2018-09-28 DIAGNOSIS — E785 Hyperlipidemia, unspecified: Secondary | ICD-10-CM | POA: Diagnosis not present

## 2018-09-28 LAB — BAYER DCA HB A1C WAIVED: HB A1C (BAYER DCA - WAIVED): 6.8 % (ref <7.0)

## 2018-09-28 MED ORDER — GLIMEPIRIDE 2 MG PO TABS: 2.0000 mg | ORAL_TABLET | Freq: Every day | ORAL | 1 refills | Status: DC

## 2018-09-28 MED ORDER — QUINAPRIL HCL 10 MG PO TABS: 10.0000 mg | ORAL_TABLET | Freq: Every day | ORAL | 1 refills | Status: DC

## 2018-09-28 MED ORDER — CLOPIDOGREL BISULFATE 75 MG PO TABS: 75.0000 mg | ORAL_TABLET | Freq: Every day | ORAL | 1 refills | Status: DC

## 2018-09-28 MED ORDER — ISOSORBIDE MONONITRATE ER 30 MG PO TB24: 30.0000 mg | ORAL_TABLET | Freq: Every day | ORAL | 1 refills | Status: DC

## 2018-09-28 MED ORDER — ROSUVASTATIN CALCIUM 10 MG PO TABS: 10.0000 mg | ORAL_TABLET | Freq: Every day | ORAL | 1 refills | Status: DC

## 2018-09-28 MED ORDER — AMOXICILLIN-POT CLAVULANATE 875-125 MG PO TABS: 1.0000 | ORAL_TABLET | Freq: Two times a day (BID) | ORAL | 0 refills | Status: DC

## 2018-09-28 MED ORDER — METFORMIN HCL ER 750 MG PO TB24: 1500.0000 mg | ORAL_TABLET | Freq: Every day | ORAL | 1 refills | Status: DC

## 2018-09-28 MED ORDER — METOPROLOL SUCCINATE ER 25 MG PO TB24: ORAL_TABLET | ORAL | 1 refills | Status: DC

## 2018-09-28 MED ORDER — SITAGLIPTIN PHOSPHATE 100 MG PO TABS: ORAL_TABLET | ORAL | 1 refills | Status: DC

## 2018-09-28 NOTE — Patient Instructions (Signed)

## 2018-09-28 NOTE — Progress Notes (Signed)
Subjective:    Patient ID: Heidi Graham, female    DOB: 12/23/1950, 67 y.o.   MRN: [id number]  Chief Complaint  Patient presents with  . Diabetes    medication refills   Pt presents to the office today for chronic follow up. PT had CABG in 2011 and 2017 is followed by Cardiologists annually.  Diabetes  She presents for her follow-up diabetic visit. She has type 2 diabetes mellitus. Her disease course has been stable. There are no hypoglycemic associated symptoms. Pertinent negatives for diabetes include no foot paresthesias, no foot ulcerations and no visual change. There are no hypoglycemic complications. Symptoms are stable. Diabetic complications include heart disease. Pertinent negatives for diabetic complications include no CVA. Risk factors for coronary artery disease include dyslipidemia, diabetes mellitus and hypertension. Her weight is stable. She is following a generally healthy diet. Her overall blood glucose range is 110-130 mg/dl. Eye exam is current.  Hyperlipidemia  This is a chronic problem. The current episode started more than 1 year ago. The problem is controlled. Recent lipid tests were reviewed and are normal. Exacerbating diseases include obesity. Pertinent negatives include no shortness of breath. Current antihyperlipidemic treatment includes statins. The current treatment provides moderate improvement of lipids. Risk factors for coronary artery disease include dyslipidemia, diabetes mellitus, hypertension and a sedentary lifestyle.  Hypertension  This is a chronic problem. The current episode started more than 1 year ago. The problem has been resolved since onset. The problem is controlled. Pertinent negatives include no peripheral edema or shortness of breath. Risk factors for coronary artery disease include dyslipidemia, diabetes mellitus, smoking/tobacco exposure and sedentary lifestyle. The current treatment provides moderate improvement. Hypertensive end-organ damage  includes CAD/MI. There is no history of CVA.  Sinusitis  This is a new problem. The current episode started in the past 7 days. The problem has been gradually worsening since onset. Her pain is at a severity of 5/10. The pain is mild. Associated symptoms include congestion, coughing, ear pain, a hoarse voice, sinus pressure and sneezing. Pertinent negatives include no shortness of breath. Past treatments include oral decongestants. The treatment provided mild relief.      Review of Systems  HENT: Positive for congestion, ear pain, hoarse voice, sinus pressure and sneezing.   Respiratory: Positive for cough. Negative for shortness of breath.   All other systems reviewed and are negative.      Objective:   Physical Exam  Constitutional: She is oriented to person, place, and time. She appears well-developed and well-nourished. No distress.  HENT:  Head: Normocephalic and atraumatic.  Right Ear: External ear normal.  Left Ear: External ear normal.  Nose: Mucosal edema and rhinorrhea present. Right sinus exhibits maxillary sinus tenderness. Left sinus exhibits maxillary sinus tenderness.  Mouth/Throat: Oropharynx is clear and moist.  Eyes: Pupils are equal, round, and reactive to light.  Neck: Normal range of motion. Neck supple. No thyromegaly present.  Cardiovascular: Normal rate, regular rhythm, normal heart sounds and intact distal pulses.  No murmur heard. Pulmonary/Chest: Effort normal and breath sounds normal. No respiratory distress. She has no wheezes.  Abdominal: Soft. Bowel sounds are normal. She exhibits no distension. There is no tenderness.  Musculoskeletal: Normal range of motion. She exhibits no edema or tenderness.  Neurological: She is alert and oriented to person, place, and time. She has normal reflexes. No cranial nerve deficit.  Skin: Skin is warm and dry.  Psychiatric: She has a normal mood and affect. Her behavior is normal. Judgment  and thought content normal.    Vitals reviewed.   Diabetic Foot Exam - Simple   Simple Foot Form Diabetic Foot exam was performed with the following findings:  Yes 09/28/2018 10:04 AM  Visual Inspection No deformities, no ulcerations, no other skin breakdown bilaterally:  Yes Sensation Testing Intact to touch and monofilament testing bilaterally:  Yes Pulse Check Posterior Tibialis and Dorsalis pulse intact bilaterally:  Yes Comments Toenails thick and long      BP 136/64   Pulse 78   Temp 98.4 F (36.9 C) (Oral)   Ht _0  (1.6 m)   Wt 177 lb 9.6 oz (80.6 kg)   BMI 31.46 kg/m      Assessment & Plan:  KYMBERLIE Graham comes in today with chief complaint of Diabetes (medication refills)   Diagnosis and orders addressed:  1. Hypertension associated with diabetes ([place]) - CMP14+EGFR - CBC with Differential/Platelet - metoprolol succinate (TOPROL-XL) 25 MG 24 hr tablet; TAKE 1 TABLET BY MOUTH EVERY DAY  Dispense: 90 tablet; Refill: 1 - quinapril (ACCUPRIL) 10 MG tablet; Take 1 tablet (10 mg total) by mouth at bedtime. TAKE 1 TABLET (10 MG TOTAL) BY MOUTH DAILY.  Dispense: 90 tablet; Refill: 1  2. Type 2 diabetes mellitus with hyperglycemia, without long-term current use of insulin (HCC) - Bayer DCA Hb A1c Waived - CMP14+EGFR - CBC with Differential/Platelet - Microalbumin / creatinine urine ratio - glimepiride (AMARYL) 2 MG tablet; Take 1 tablet (2 mg total) by mouth daily with breakfast.  Dispense: 90 tablet; Refill: 1 - metFORMIN (GLUCOPHAGE XR) 750 MG 24 hr tablet; Take 2 tablets (1,500 mg total) by mouth daily with breakfast.  Dispense: 180 tablet; Refill: 1 - quinapril (ACCUPRIL) 10 MG tablet; Take 1 tablet (10 mg total) by mouth at bedtime. TAKE 1 TABLET (10 MG TOTAL) BY MOUTH DAILY.  Dispense: 90 tablet; Refill: 1 - sitaGLIPtin (JANUVIA) 100 MG tablet; TAKE 1 TABLET BY MOUTH EVERY DAY PATIENT MUST BE SEEN  Dispense: 90 tablet; Refill: 1 - amoxicillin-clavulanate (AUGMENTIN) 875-125 MG tablet;  Take 1 tablet by mouth 2 (two) times daily.  Dispense: 14 tablet; Refill: 0  3. Hyperlipidemia associated with type 2 diabetes mellitus (HCC) - CMP14+EGFR - CBC with Differential/Platelet - Lipid panel - rosuvastatin (CRESTOR) 10 MG tablet; Take 1 tablet (10 mg total) by mouth at bedtime.  Dispense: 90 tablet; Refill: 1  4. Current smoker - CMP14+EGFR - CBC with Differential/Platelet  5. Hx of CABG - CMP14+EGFR - CBC with Differential/Platelet - clopidogrel (PLAVIX) 75 MG tablet; Take 1 tablet (75 mg total) by mouth daily.  Dispense: 90 tablet; Refill: 1  6. Acute sinusitis, recurrence not specified, unspecified location - Take meds as prescribed - Use a cool mist humidifier  -Use saline nose sprays frequently -Force fluids -For any cough or congestion  Use plain Mucinex- regular strength or max strength is fine -For fever or aces or pains- take tylenol or ibuprofen. -Throat lozenges if help - amoxicillin-clavulanate (AUGMENTIN) 875-125 MG tablet; Take 1 tablet by mouth 2 (two) times daily.  Dispense: 14 tablet; Refill: 0  Labs pending Health Maintenance reviewed Diet and exercise encouraged  Follow up plan: 6 months  Evelina Dun, FNP

## 2018-09-29 ENCOUNTER — Other Ambulatory Visit: Payer: Self-pay | Admitting: Family

## 2018-09-29 LAB — CBC WITH DIFFERENTIAL/PLATELET
BASOS: 1 %
Basophils Absolute: 0 10*3/uL (ref 0.0–0.2)
EOS (ABSOLUTE): 0.2 10*3/uL (ref 0.0–0.4)
Eos: 2 %
Hematocrit: 37.6 % (ref 34.0–46.6)
Hemoglobin: 12.6 g/dL (ref 11.1–15.9)
IMMATURE GRANULOCYTES: 0 %
Immature Grans (Abs): 0 10*3/uL (ref 0.0–0.1)
LYMPHS: 27 %
Lymphocytes Absolute: 1.8 10*3/uL (ref 0.7–3.1)
MCH: 30.9 pg (ref 26.6–33.0)
MCHC: 33.5 g/dL (ref 31.5–35.7)
MCV: 92 fL (ref 79–97)
MONOS ABS: 0.4 10*3/uL (ref 0.1–0.9)
Monocytes: 6 %
NEUTROS PCT: 64 %
Neutrophils Absolute: 4.4 10*3/uL (ref 1.4–7.0)
PLATELETS: 197 10*3/uL (ref 150–450)
RBC: 4.08 x10E6/uL (ref 3.77–5.28)
RDW: 12.3 % (ref 12.3–15.4)
WBC: 6.9 10*3/uL (ref 3.4–10.8)

## 2018-09-29 LAB — CMP14+EGFR
ALK PHOS: 78 IU/L (ref 39–117)
ALT: 11 IU/L (ref 0–32)
AST: 13 IU/L (ref 0–40)
Albumin/Globulin Ratio: 1.7 (ref 1.2–2.2)
Albumin: 4.3 g/dL (ref 3.6–4.8)
BUN/Creatinine Ratio: 16 (ref 12–28)
BUN: 11 mg/dL (ref 8–27)
Bilirubin Total: 0.4 mg/dL (ref 0.0–1.2)
CALCIUM: 9.2 mg/dL (ref 8.7–10.3)
CO2: 26 mmol/L (ref 20–29)
CREATININE: 0.69 mg/dL (ref 0.57–1.00)
Chloride: 101 mmol/L (ref 96–106)
GFR calc Af Amer: 104 mL/min/{1.73_m2} (ref >59)
GFR, EST NON AFRICAN AMERICAN: 90 mL/min/{1.73_m2} (ref >59)
GLOBULIN, TOTAL: 2.5 g/dL (ref 1.5–4.5)
Glucose: 228 mg/dL — ABNORMAL HIGH (ref 65–99)
POTASSIUM: 3.9 mmol/L (ref 3.5–5.2)
SODIUM: 141 mmol/L (ref 134–144)
Total Protein: 6.8 g/dL (ref 6.0–8.5)

## 2018-09-29 LAB — MICROALBUMIN / CREATININE URINE RATIO
Creatinine, Urine: 47.9 mg/dL
Microalb/Creat Ratio: 45.9 mg/g creat — ABNORMAL HIGH (ref 0.0–30.0)
Microalbumin, Urine: 22 ug/mL

## 2018-09-29 LAB — LIPID PANEL
CHOLESTEROL TOTAL: 164 mg/dL (ref 100–199)
Chol/HDL Ratio: 4.7 ratio — ABNORMAL HIGH (ref 0.0–4.4)
HDL: 35 mg/dL — AB (ref >39)
LDL Calculated: 108 mg/dL — ABNORMAL HIGH (ref 0–99)
TRIGLYCERIDES: 103 mg/dL (ref 0–149)
VLDL Cholesterol Cal: 21 mg/dL (ref 5–40)

## 2018-11-09 DIAGNOSIS — Z23 Encounter for immunization: Secondary | ICD-10-CM | POA: Diagnosis not present

## 2019-03-29 ENCOUNTER — Other Ambulatory Visit: Payer: Self-pay | Admitting: Family

## 2019-03-29 ENCOUNTER — Ambulatory Visit: Payer: Medicare Other | Admitting: Family

## 2019-03-29 DIAGNOSIS — E785 Hyperlipidemia, unspecified: Principal | ICD-10-CM

## 2019-03-29 DIAGNOSIS — E1159 Type 2 diabetes mellitus with other circulatory complications: Secondary | ICD-10-CM

## 2019-03-29 DIAGNOSIS — E1165 Type 2 diabetes mellitus with hyperglycemia: Secondary | ICD-10-CM

## 2019-03-29 DIAGNOSIS — E1169 Type 2 diabetes mellitus with other specified complication: Secondary | ICD-10-CM

## 2019-03-29 DIAGNOSIS — I10 Essential (primary) hypertension: Principal | ICD-10-CM

## 2019-04-06 ENCOUNTER — Other Ambulatory Visit: Payer: Self-pay | Admitting: Family

## 2019-04-06 DIAGNOSIS — E1165 Type 2 diabetes mellitus with hyperglycemia: Secondary | ICD-10-CM

## 2019-04-11 ENCOUNTER — Other Ambulatory Visit: Payer: Self-pay | Admitting: Family

## 2019-04-11 DIAGNOSIS — E1165 Type 2 diabetes mellitus with hyperglycemia: Secondary | ICD-10-CM

## 2019-04-29 ENCOUNTER — Telehealth: Payer: Self-pay | Admitting: Family

## 2019-04-29 ENCOUNTER — Other Ambulatory Visit: Payer: Self-pay

## 2019-04-29 NOTE — Telephone Encounter (Signed)
appt changed to televisit.

## 2019-04-30 ENCOUNTER — Encounter: Payer: Self-pay | Admitting: Family

## 2019-04-30 ENCOUNTER — Ambulatory Visit (INDEPENDENT_AMBULATORY_CARE_PROVIDER_SITE_OTHER): Payer: Medicare Other | Admitting: Family

## 2019-04-30 DIAGNOSIS — F17200 Nicotine dependence, unspecified, uncomplicated: Secondary | ICD-10-CM | POA: Diagnosis not present

## 2019-04-30 DIAGNOSIS — Z951 Presence of aortocoronary bypass graft: Secondary | ICD-10-CM

## 2019-04-30 DIAGNOSIS — E785 Hyperlipidemia, unspecified: Secondary | ICD-10-CM

## 2019-04-30 DIAGNOSIS — E1169 Type 2 diabetes mellitus with other specified complication: Secondary | ICD-10-CM | POA: Diagnosis not present

## 2019-04-30 DIAGNOSIS — E1159 Type 2 diabetes mellitus with other circulatory complications: Secondary | ICD-10-CM

## 2019-04-30 DIAGNOSIS — I10 Essential (primary) hypertension: Secondary | ICD-10-CM

## 2019-04-30 DIAGNOSIS — E1165 Type 2 diabetes mellitus with hyperglycemia: Secondary | ICD-10-CM | POA: Diagnosis not present

## 2019-04-30 DIAGNOSIS — I152 Hypertension secondary to endocrine disorders: Secondary | ICD-10-CM

## 2019-04-30 MED ORDER — ISOSORBIDE MONONITRATE ER 30 MG PO TB24: 30.0000 mg | ORAL_TABLET | Freq: Every day | ORAL | 1 refills | Status: DC

## 2019-04-30 MED ORDER — BLOOD GLUCOSE METER KIT: PACK | 0 refills | Status: DC

## 2019-04-30 MED ORDER — METOPROLOL SUCCINATE ER 25 MG PO TB24: ORAL_TABLET | ORAL | 1 refills | Status: DC

## 2019-04-30 MED ORDER — CLOPIDOGREL BISULFATE 75 MG PO TABS: 75.0000 mg | ORAL_TABLET | Freq: Every day | ORAL | 1 refills | Status: DC

## 2019-04-30 MED ORDER — METFORMIN HCL ER 750 MG PO TB24: 1500.0000 mg | ORAL_TABLET | Freq: Every day | ORAL | 1 refills | Status: DC

## 2019-04-30 MED ORDER — QUINAPRIL HCL 10 MG PO TABS: 10.0000 mg | ORAL_TABLET | Freq: Every day | ORAL | 1 refills | Status: DC

## 2019-04-30 MED ORDER — GLIMEPIRIDE 2 MG PO TABS: 2.0000 mg | ORAL_TABLET | Freq: Every day | ORAL | 1 refills | Status: DC

## 2019-04-30 MED ORDER — SITAGLIPTIN PHOSPHATE 100 MG PO TABS: ORAL_TABLET | ORAL | 1 refills | Status: DC

## 2019-04-30 MED ORDER — ROSUVASTATIN CALCIUM 10 MG PO TABS: 10.0000 mg | ORAL_TABLET | Freq: Every day | ORAL | 1 refills | Status: DC

## 2019-04-30 NOTE — Progress Notes (Signed)
 Virtual Visit via telephone Note  I connected with Heidi Graham on 04/30/19 at 8:11 AM by telephone and verified that I am speaking with the correct person using two identifiers. [name] is currently located at home  and no one is currently with her during visit. The provider, [name] Hawks, FNP is located in their office at time of visit.  I discussed the limitations, risks, security and privacy concerns of performing an evaluation and management service by telephone and the availability of in person appointments. I also discussed with the patient that there may be a patient responsible charge related to this service. The patient expressed understanding and agreed to proceed.   History and Present Illness:  Pt calls the office today for chronic follow up. PT had CABG in 2011 and 2017 is followed by Cardiologists annually. Diabetes  She presents for her follow-up diabetic visit. She has type 2 diabetes mellitus. Her disease course has been stable. There are no hypoglycemic associated symptoms. Pertinent negatives for hypoglycemia include no headaches. Pertinent negatives for diabetes include no blurred vision, no foot paresthesias and no visual change. Symptoms are stable. Diabetic complications include heart disease. Risk factors for coronary artery disease include dyslipidemia, diabetes mellitus, hypertension, female sex, post-menopausal and sedentary lifestyle. Her overall blood glucose range is 130-140 mg/dl. Eye exam is current.  Hypertension  This is a chronic problem. The current episode started more than 1 year ago. Progression since onset: 120/96. The problem is uncontrolled. Pertinent negatives include no blurred vision, headaches, peripheral edema or shortness of breath. Risk factors for coronary artery disease include dyslipidemia, diabetes mellitus, obesity and sedentary lifestyle. Hypertensive end-organ damage includes CAD/MI. There is no history of heart failure.   Hyperlipidemia  This is a chronic problem. The current episode started more than 1 year ago. The problem is uncontrolled. Recent lipid tests were reviewed and are normal. Exacerbating diseases include obesity. Pertinent negatives include no shortness of breath. Current antihyperlipidemic treatment includes statins. The current treatment provides moderate improvement of lipids. Risk factors for coronary artery disease include dyslipidemia, diabetes mellitus, hypertension and post-menopausal.      Review of Systems  Eyes: Negative for blurred vision.  Respiratory: Negative for shortness of breath.   Neurological: Negative for headaches.  All other systems reviewed and are negative.    Observations/Objective: No SOB or distress noted  Assessment and Plan: Heidi Graham comes in today with chief complaint of No chief complaint on file.   Diagnosis and orders addressed:  1. Type 2 diabetes mellitus with hyperglycemia, without long-term current use of insulin (HCC) - glimepiride (AMARYL) 2 MG tablet; Take 1 tablet (2 mg total) by mouth daily with breakfast.  Dispense: 90 tablet; Refill: 1 - metFORMIN (GLUCOPHAGE XR) 750 MG 24 hr tablet; Take 2 tablets (1,500 mg total) by mouth daily with breakfast.  Dispense: 180 tablet; Refill: 1 - sitaGLIPtin (JANUVIA) 100 MG tablet; TAKE 1 TABLET BY MOUTH EVERY DAY PATIENT MUST BE SEEN  Dispense: 90 tablet; Refill: 1 - quinapril (ACCUPRIL) 10 MG tablet; Take 1 tablet (10 mg total) by mouth at bedtime. TAKE 1 TABLET (10 MG TOTAL) BY MOUTH DAILY.  Dispense: 90 tablet; Refill: 1 - blood glucose meter kit and supplies; Dispense based on patient and insurance preference. Use up to four times daily as directed. (FOR ICD-10 E10.9, E11.9).  Dispense: 1 each; Refill: 0  2. Hx of CABG - clopidogrel (PLAVIX) 75 MG tablet; Take 1 tablet (75 mg total) by mouth   daily.  Dispense: 90 tablet; Refill: 1 - isosorbide mononitrate (IMDUR) 30 MG 24 hr tablet; Take 1 tablet  (30 mg total) by mouth daily.  Dispense: 90 tablet; Refill: 1  3. Hyperlipidemia associated with type 2 diabetes mellitus (HCC) - rosuvastatin (CRESTOR) 10 MG tablet; Take 1 tablet (10 mg total) by mouth at bedtime.  Dispense: 90 tablet; Refill: 1  4. Hypertension associated with diabetes (HCC) - metoprolol succinate (TOPROL-XL) 25 MG 24 hr tablet; TAKE 1 TABLET BY MOUTH EVERY DAY  Dispense: 90 tablet; Refill: 1 - quinapril (ACCUPRIL) 10 MG tablet; Take 1 tablet (10 mg total) by mouth at bedtime. TAKE 1 TABLET (10 MG TOTAL) BY MOUTH DAILY.  Dispense: 90 tablet; Refill: 1  5. Current smoker    Labs reviewed- Will order on next vist Health Maintenance reviewed Diet and exercise encouraged  Follow up plan: 2 months      I discussed the assessment and treatment plan with the patient. The patient was provided an opportunity to ask questions and all were answered. The patient agreed with the plan and demonstrated an understanding of the instructions.   The patient was advised to call back or seek an in-person evaluation if the symptoms worsen or if the condition fails to improve as anticipated.  The above assessment and management plan was discussed with the patient. The patient verbalized understanding of and has agreed to the management plan. Patient is aware to call the clinic if symptoms persist or worsen. Patient is aware when to return to the clinic for a follow-up visit. Patient educated on when it is appropriate to go to the emergency department.   Time call ended: 8:27AM   I provided 16 minutes of non-face-to-face time during this encounter.    [name] Hawks, FNP   

## 2019-07-01 ENCOUNTER — Ambulatory Visit: Payer: Medicare Other | Admitting: Family

## 2019-08-11 DIAGNOSIS — H2513 Age-related nuclear cataract, bilateral: Secondary | ICD-10-CM | POA: Diagnosis not present

## 2019-08-11 DIAGNOSIS — H35031 Hypertensive retinopathy, right eye: Secondary | ICD-10-CM | POA: Diagnosis not present

## 2019-08-11 DIAGNOSIS — E119 Type 2 diabetes mellitus without complications: Secondary | ICD-10-CM | POA: Diagnosis not present

## 2019-08-11 LAB — HM DIABETES EYE EXAM

## 2019-09-07 DIAGNOSIS — Z23 Encounter for immunization: Secondary | ICD-10-CM | POA: Diagnosis not present

## 2019-09-29 DIAGNOSIS — E782 Mixed hyperlipidemia: Secondary | ICD-10-CM | POA: Diagnosis not present

## 2019-09-29 DIAGNOSIS — I10 Essential (primary) hypertension: Secondary | ICD-10-CM | POA: Diagnosis not present

## 2019-10-22 ENCOUNTER — Other Ambulatory Visit: Payer: Self-pay | Admitting: Family

## 2019-10-22 DIAGNOSIS — E1165 Type 2 diabetes mellitus with hyperglycemia: Secondary | ICD-10-CM

## 2019-10-22 DIAGNOSIS — E1169 Type 2 diabetes mellitus with other specified complication: Secondary | ICD-10-CM

## 2019-10-31 ENCOUNTER — Other Ambulatory Visit: Payer: Self-pay | Admitting: Family

## 2019-10-31 DIAGNOSIS — E1165 Type 2 diabetes mellitus with hyperglycemia: Secondary | ICD-10-CM

## 2019-11-10 ENCOUNTER — Other Ambulatory Visit: Payer: Self-pay

## 2019-11-16 ENCOUNTER — Other Ambulatory Visit: Payer: Self-pay | Admitting: Family

## 2019-11-16 DIAGNOSIS — E1165 Type 2 diabetes mellitus with hyperglycemia: Secondary | ICD-10-CM

## 2019-11-16 DIAGNOSIS — E785 Hyperlipidemia, unspecified: Secondary | ICD-10-CM

## 2019-11-16 DIAGNOSIS — Z951 Presence of aortocoronary bypass graft: Secondary | ICD-10-CM

## 2019-11-16 DIAGNOSIS — E1159 Type 2 diabetes mellitus with other circulatory complications: Secondary | ICD-10-CM

## 2019-11-16 MED ORDER — CLOPIDOGREL BISULFATE 75 MG PO TABS: 75.0000 mg | ORAL_TABLET | Freq: Every day | ORAL | 0 refills | Status: DC

## 2019-11-16 MED ORDER — ISOSORBIDE MONONITRATE ER 30 MG PO TB24: 30.0000 mg | ORAL_TABLET | Freq: Every day | ORAL | 0 refills | Status: DC

## 2019-11-16 MED ORDER — ROSUVASTATIN CALCIUM 10 MG PO TABS: 10.0000 mg | ORAL_TABLET | Freq: Every day | ORAL | 0 refills | Status: DC

## 2019-11-16 MED ORDER — METOPROLOL SUCCINATE ER 25 MG PO TB24: ORAL_TABLET | ORAL | 0 refills | Status: DC

## 2019-11-16 MED ORDER — GLIMEPIRIDE 2 MG PO TABS: 2.0000 mg | ORAL_TABLET | Freq: Every day | ORAL | 0 refills | Status: DC

## 2019-11-16 MED ORDER — QUINAPRIL HCL 10 MG PO TABS: 10.0000 mg | ORAL_TABLET | Freq: Every day | ORAL | 0 refills | Status: DC

## 2019-11-16 MED ORDER — METFORMIN HCL ER 750 MG PO TB24: 1500.0000 mg | ORAL_TABLET | Freq: Every day | ORAL | 0 refills | Status: DC

## 2019-11-16 MED ORDER — ALBUTEROL SULFATE (2.5 MG/3ML) 0.083% IN NEBU: 2.5000 mg | INHALATION_SOLUTION | RESPIRATORY_TRACT | 0 refills | Status: DC | PRN

## 2019-11-16 MED ORDER — SITAGLIPTIN PHOSPHATE 100 MG PO TABS: ORAL_TABLET | ORAL | 0 refills | Status: DC

## 2019-11-16 NOTE — Telephone Encounter (Signed)
Sent refills over for pt = aware NTBS asap

## 2020-02-09 ENCOUNTER — Other Ambulatory Visit: Payer: Self-pay | Admitting: Family

## 2020-02-09 DIAGNOSIS — E1165 Type 2 diabetes mellitus with hyperglycemia: Secondary | ICD-10-CM

## 2020-03-02 ENCOUNTER — Other Ambulatory Visit: Payer: Self-pay | Admitting: Family

## 2020-03-02 DIAGNOSIS — E1165 Type 2 diabetes mellitus with hyperglycemia: Secondary | ICD-10-CM

## 2020-03-03 ENCOUNTER — Other Ambulatory Visit: Payer: Self-pay | Admitting: Family

## 2020-03-03 DIAGNOSIS — E1165 Type 2 diabetes mellitus with hyperglycemia: Secondary | ICD-10-CM

## 2020-03-03 NOTE — Telephone Encounter (Signed)
Patient last seen 04/30/2019. Patient needs an appointment.

## 2020-03-03 NOTE — Telephone Encounter (Signed)
Lmtcb to schedule an appt for med refill  

## 2020-03-11 ENCOUNTER — Other Ambulatory Visit: Payer: Self-pay | Admitting: Family

## 2020-03-11 DIAGNOSIS — E1165 Type 2 diabetes mellitus with hyperglycemia: Secondary | ICD-10-CM

## 2020-03-12 ENCOUNTER — Other Ambulatory Visit: Payer: Self-pay | Admitting: Family

## 2020-03-12 DIAGNOSIS — E1165 Type 2 diabetes mellitus with hyperglycemia: Secondary | ICD-10-CM

## 2020-04-10 ENCOUNTER — Other Ambulatory Visit: Payer: Self-pay | Admitting: Family

## 2020-04-10 DIAGNOSIS — E1165 Type 2 diabetes mellitus with hyperglycemia: Secondary | ICD-10-CM

## 2020-04-10 NOTE — Telephone Encounter (Signed)
Hawks. NTBS LOV 04/30/19

## 2020-04-10 NOTE — Telephone Encounter (Signed)
Lmtcb to schedule appt.

## 2020-04-11 ENCOUNTER — Other Ambulatory Visit: Payer: Self-pay | Admitting: Family

## 2020-04-11 DIAGNOSIS — E1159 Type 2 diabetes mellitus with other circulatory complications: Secondary | ICD-10-CM

## 2020-04-11 DIAGNOSIS — I10 Essential (primary) hypertension: Secondary | ICD-10-CM | POA: Diagnosis not present

## 2020-04-11 NOTE — Telephone Encounter (Signed)
lmtcb to schedule follow up visit for refills.

## 2020-04-11 NOTE — Telephone Encounter (Signed)
Hawks. NTBS 30 days given 02/09/20

## 2020-04-12 ENCOUNTER — Other Ambulatory Visit: Payer: Self-pay | Admitting: Family

## 2020-04-12 DIAGNOSIS — E1165 Type 2 diabetes mellitus with hyperglycemia: Secondary | ICD-10-CM

## 2020-05-04 ENCOUNTER — Other Ambulatory Visit: Payer: Self-pay | Admitting: Family

## 2020-05-04 DIAGNOSIS — I152 Hypertension secondary to endocrine disorders: Secondary | ICD-10-CM

## 2020-05-04 NOTE — Telephone Encounter (Signed)
Hawks. NTBS 30 days given 04/11/20

## 2020-05-05 NOTE — Telephone Encounter (Signed)
Aware will need to schedule an office visit with blood work.

## 2020-07-01 DIAGNOSIS — I10 Essential (primary) hypertension: Secondary | ICD-10-CM | POA: Diagnosis not present

## 2020-07-01 DIAGNOSIS — F17210 Nicotine dependence, cigarettes, uncomplicated: Secondary | ICD-10-CM | POA: Diagnosis not present

## 2020-07-01 DIAGNOSIS — J40 Bronchitis, not specified as acute or chronic: Secondary | ICD-10-CM | POA: Diagnosis not present

## 2020-07-01 DIAGNOSIS — Z720 Tobacco use: Secondary | ICD-10-CM | POA: Diagnosis not present

## 2020-07-01 DIAGNOSIS — R05 Cough: Secondary | ICD-10-CM | POA: Diagnosis not present

## 2020-07-01 DIAGNOSIS — I2510 Atherosclerotic heart disease of native coronary artery without angina pectoris: Secondary | ICD-10-CM | POA: Diagnosis not present

## 2020-07-01 DIAGNOSIS — E119 Type 2 diabetes mellitus without complications: Secondary | ICD-10-CM | POA: Diagnosis not present

## 2020-10-04 DIAGNOSIS — Z23 Encounter for immunization: Secondary | ICD-10-CM | POA: Diagnosis not present

## 2020-11-14 ENCOUNTER — Ambulatory Visit (INDEPENDENT_AMBULATORY_CARE_PROVIDER_SITE_OTHER): Payer: Medicare Other

## 2020-11-14 ENCOUNTER — Ambulatory Visit (INDEPENDENT_AMBULATORY_CARE_PROVIDER_SITE_OTHER): Payer: Medicare Other | Admitting: Family

## 2020-11-14 ENCOUNTER — Encounter: Payer: Self-pay | Admitting: Family

## 2020-11-14 ENCOUNTER — Other Ambulatory Visit: Payer: Self-pay

## 2020-11-14 VITALS — BP 136/69 | HR 87 | Temp 97.3°F | Ht 63.0 in | Wt 160.4 lb

## 2020-11-14 DIAGNOSIS — E785 Hyperlipidemia, unspecified: Secondary | ICD-10-CM | POA: Diagnosis not present

## 2020-11-14 DIAGNOSIS — E1159 Type 2 diabetes mellitus with other circulatory complications: Secondary | ICD-10-CM | POA: Diagnosis not present

## 2020-11-14 DIAGNOSIS — Z122 Encounter for screening for malignant neoplasm of respiratory organs: Secondary | ICD-10-CM

## 2020-11-14 DIAGNOSIS — E1165 Type 2 diabetes mellitus with hyperglycemia: Secondary | ICD-10-CM | POA: Diagnosis not present

## 2020-11-14 DIAGNOSIS — I25709 Atherosclerosis of coronary artery bypass graft(s), unspecified, with unspecified angina pectoris: Secondary | ICD-10-CM

## 2020-11-14 DIAGNOSIS — F17200 Nicotine dependence, unspecified, uncomplicated: Secondary | ICD-10-CM

## 2020-11-14 DIAGNOSIS — Z951 Presence of aortocoronary bypass graft: Secondary | ICD-10-CM | POA: Diagnosis not present

## 2020-11-14 DIAGNOSIS — E1169 Type 2 diabetes mellitus with other specified complication: Secondary | ICD-10-CM

## 2020-11-14 DIAGNOSIS — Z780 Asymptomatic menopausal state: Secondary | ICD-10-CM

## 2020-11-14 DIAGNOSIS — J42 Unspecified chronic bronchitis: Secondary | ICD-10-CM

## 2020-11-14 DIAGNOSIS — I152 Hypertension secondary to endocrine disorders: Secondary | ICD-10-CM

## 2020-11-14 LAB — BAYER DCA HB A1C WAIVED: HB A1C (BAYER DCA - WAIVED): 11.5 % — ABNORMAL HIGH

## 2020-11-14 LAB — LIPID PANEL

## 2020-11-14 MED ORDER — EMPAGLIFLOZIN 10 MG PO TABS: 10.0000 mg | ORAL_TABLET | Freq: Every day | ORAL | 1 refills | Status: DC

## 2020-11-14 MED ORDER — ROSUVASTATIN CALCIUM 10 MG PO TABS: 10.0000 mg | ORAL_TABLET | Freq: Every day | ORAL | 0 refills | Status: DC

## 2020-11-14 MED ORDER — ASPIRIN EC 81 MG PO TBEC: 81.0000 mg | DELAYED_RELEASE_TABLET | Freq: Every day | ORAL | 11 refills | Status: AC

## 2020-11-14 MED ORDER — QUINAPRIL HCL 10 MG PO TABS: 10.0000 mg | ORAL_TABLET | Freq: Every day | ORAL | 0 refills | Status: DC

## 2020-11-14 MED ORDER — METFORMIN HCL ER 750 MG PO TB24: 1500.0000 mg | ORAL_TABLET | Freq: Every day | ORAL | 0 refills | Status: DC

## 2020-11-14 NOTE — Progress Notes (Signed)
Subjective:    Patient ID: Heidi Graham, female    DOB: 05/05/1951, 69 y.o.   MRN: [id number]  Chief Complaint  Patient presents with  . Hypertension   Pt presents to  the office today for chronic follow up. PT had CABG in 2011 and 2017 is followed by Cardiologists annually. Hypertension This is a chronic problem. The current episode started more than 1 year ago. The problem has been resolved since onset. The problem is controlled. Associated symptoms include malaise/fatigue. Pertinent negatives include no blurred vision, peripheral edema or shortness of breath. Risk factors for coronary artery disease include dyslipidemia, obesity and sedentary lifestyle. The current treatment provides moderate improvement.  Diabetes She presents for her follow-up diabetic visit. She has type 2 diabetes mellitus. There are no hypoglycemic associated symptoms. Pertinent negatives for diabetes include no blurred vision and no foot paresthesias. Symptoms are stable. Risk factors for coronary artery disease include dyslipidemia, diabetes mellitus, hypertension, sedentary lifestyle and post-menopausal. She is following a generally unhealthy diet. Her overall blood glucose range is 180-200 mg/dl. An ACE inhibitor/angiotensin II receptor blocker is being taken. Eye exam is current.  Hyperlipidemia This is a chronic problem. The current episode started more than 1 year ago. The problem is controlled. Recent lipid tests were reviewed and are normal. Exacerbating diseases include obesity. Pertinent negatives include no shortness of breath. Current antihyperlipidemic treatment includes statins. Risk factors for coronary artery disease include dyslipidemia, diabetes mellitus, hypertension, post-menopausal and a sedentary lifestyle.  Nicotine Dependence Presents for follow-up visit. Her urge triggers include company of smokers. The symptoms have been stable. She smokes < 1/2 a pack of cigarettes per day. Compliance with prior  treatments has been good.      Review of Systems  Constitutional: Positive for malaise/fatigue.  Eyes: Negative for blurred vision.  Respiratory: Negative for shortness of breath.   All other systems reviewed and are negative.      Objective:   Physical Exam Vitals reviewed.  Constitutional:      General: She is not in acute distress.    Appearance: She is well-developed.  HENT:     Head: Normocephalic and atraumatic.     Right Ear: Tympanic membrane normal.     Left Ear: Tympanic membrane normal.  Eyes:     Pupils: Pupils are equal, round, and reactive to light.  Neck:     Thyroid: No thyromegaly.  Cardiovascular:     Rate and Rhythm: Normal rate and regular rhythm.     Heart sounds: Normal heart sounds. No murmur heard.   Pulmonary:     Effort: Pulmonary effort is normal. No respiratory distress.     Breath sounds: Decreased breath sounds present. No wheezing.  Abdominal:     General: Bowel sounds are normal. There is no distension.     Palpations: Abdomen is soft.     Tenderness: There is no abdominal tenderness.  Musculoskeletal:        General: No tenderness. Normal range of motion.     Cervical back: Normal range of motion and neck supple.  Skin:    General: Skin is warm and dry.  Neurological:     Mental Status: She is alert and oriented to person, place, and time.     Cranial Nerves: No cranial nerve deficit.     Deep Tendon Reflexes: Reflexes are normal and symmetric.  Psychiatric:        Behavior: Behavior normal.        Thought Content:  Thought content normal.        Judgment: Judgment normal.       BP 136/69   Pulse 87   Temp (!) 97.3 F (36.3 C) (Temporal)   Ht $R'5\' 3"'SY$  (1.6 m)   Wt 160 lb 6.4 oz (72.8 kg)   SpO2 98%   BMI 28.41 kg/m      Assessment & Plan:  Heidi Graham comes in today with chief complaint of Hypertension   Diagnosis and orders addressed:  1. Type 2 diabetes mellitus with hyperglycemia, without long-term current use  of insulin (HCC) - Bayer DCA Hb A1c Waived - Microalbumin / creatinine urine ratio - quinapril (ACCUPRIL) 10 MG tablet; Take 1 tablet (10 mg total) by mouth at bedtime. TAKE 1 TABLET (10 MG TOTAL) BY MOUTH DAILY.  Dispense: 90 tablet; Refill: 0 - metFORMIN (GLUCOPHAGE-XR) 750 MG 24 hr tablet; Take 2 tablets (1,500 mg total) by mouth daily with breakfast. (Needs to be seen before next refill)  Dispense: 60 tablet; Refill: 0 - CMP14+EGFR - CBC with Differential/Platelet  2. Hypertension associated with diabetes ([facility]) - quinapril (ACCUPRIL) 10 MG tablet; Take 1 tablet (10 mg total) by mouth at bedtime. TAKE 1 TABLET (10 MG TOTAL) BY MOUTH DAILY.  Dispense: 90 tablet; Refill: 0 - CMP14+EGFR - CBC with Differential/Platelet  3. Hyperlipidemia, unspecified hyperlipidemia type - CMP14+EGFR - CBC with Differential/Platelet - Lipid panel  4. Hx of CABG - CMP14+EGFR - CBC with Differential/Platelet  5. Current smoker Smoking cessation discussed CT scan pending  - CMP14+EGFR - CBC with Differential/Platelet - CT CHEST LUNG CA SCREEN LOW DOSE W/O CM; Future  6. Hyperlipidemia associated with type 2 diabetes mellitus (HCC) - rosuvastatin (CRESTOR) 10 MG tablet; Take 1 tablet (10 mg total) by mouth at bedtime.  Dispense: 90 tablet; Refill: 0 - CMP14+EGFR - CBC with Differential/Platelet  7. Atherosclerosis of coronary artery bypass graft with angina pectoris, unspecified whether native or transplanted heart (HCC) - CMP14+EGFR - CBC with Differential/Platelet  8. Screening for lung cancer - CT CHEST LUNG CA SCREEN LOW DOSE W/O CM; Future  9. Chronic bronchitis, unspecified chronic bronchitis type ([place]) - CT CHEST LUNG CA SCREEN LOW DOSE W/O CM; Future   Labs pending Health Maintenance reviewed Diet and exercise encouraged  Follow up plan: 1 months and needs follow up with Almyra Free for diabetic education    Evelina Dun, [place]

## 2020-11-14 NOTE — Patient Instructions (Signed)

## 2020-11-15 DIAGNOSIS — M810 Age-related osteoporosis without current pathological fracture: Secondary | ICD-10-CM | POA: Diagnosis not present

## 2020-11-15 DIAGNOSIS — Z780 Asymptomatic menopausal state: Secondary | ICD-10-CM | POA: Diagnosis not present

## 2020-11-15 LAB — CMP14+EGFR
ALT: 12 IU/L (ref 0–32)
AST: 15 IU/L (ref 0–40)
Albumin/Globulin Ratio: 1.9 (ref 1.2–2.2)
Albumin: 4.6 g/dL (ref 3.8–4.8)
Alkaline Phosphatase: 96 IU/L (ref 44–121)
BUN/Creatinine Ratio: 13 (ref 12–28)
BUN: 10 mg/dL (ref 8–27)
Bilirubin Total: 0.5 mg/dL (ref 0.0–1.2)
CO2: 25 mmol/L (ref 20–29)
Calcium: 9.7 mg/dL (ref 8.7–10.3)
Chloride: 97 mmol/L (ref 96–106)
Creatinine, Ser: 0.77 mg/dL (ref 0.57–1.00)
GFR calc Af Amer: 91 mL/min/{1.73_m2} (ref >59)
GFR calc non Af Amer: 79 mL/min/{1.73_m2} (ref >59)
Globulin, Total: 2.4 g/dL (ref 1.5–4.5)
Glucose: 381 mg/dL — ABNORMAL HIGH (ref 65–99)
Potassium: 4.1 mmol/L (ref 3.5–5.2)
Sodium: 138 mmol/L (ref 134–144)
Total Protein: 7 g/dL (ref 6.0–8.5)

## 2020-11-15 LAB — CBC WITH DIFFERENTIAL/PLATELET
Basophils Absolute: 0 10*3/uL (ref 0.0–0.2)
Basos: 1 %
EOS (ABSOLUTE): 0.1 10*3/uL (ref 0.0–0.4)
Eos: 1 %
Hematocrit: 47.1 % — ABNORMAL HIGH (ref 34.0–46.6)
Hemoglobin: 15.2 g/dL (ref 11.1–15.9)
Immature Grans (Abs): 0 10*3/uL (ref 0.0–0.1)
Immature Granulocytes: 0 %
Lymphocytes Absolute: 1.8 10*3/uL (ref 0.7–3.1)
Lymphs: 31 %
MCH: 30.2 pg (ref 26.6–33.0)
MCHC: 32.3 g/dL (ref 31.5–35.7)
MCV: 94 fL (ref 79–97)
Monocytes Absolute: 0.3 10*3/uL (ref 0.1–0.9)
Monocytes: 6 %
Neutrophils Absolute: 3.4 10*3/uL (ref 1.4–7.0)
Neutrophils: 61 %
Platelets: 197 10*3/uL (ref 150–450)
RBC: 5.04 x10E6/uL (ref 3.77–5.28)
RDW: 12.2 % (ref 11.7–15.4)
WBC: 5.6 10*3/uL (ref 3.4–10.8)

## 2020-11-15 LAB — LIPID PANEL
Chol/HDL Ratio: 4.5 ratio — ABNORMAL HIGH (ref 0.0–4.4)
Cholesterol, Total: 197 mg/dL (ref 100–199)
HDL: 44 mg/dL (ref >39)
LDL Chol Calc (NIH): 132 mg/dL — ABNORMAL HIGH (ref 0–99)
Triglycerides: 114 mg/dL (ref 0–149)
VLDL Cholesterol Cal: 21 mg/dL (ref 5–40)

## 2020-11-15 LAB — MICROALBUMIN / CREATININE URINE RATIO
Creatinine, Urine: 43.4 mg/dL
Microalb/Creat Ratio: 157 mg/g creat — ABNORMAL HIGH (ref 0–29)
Microalbumin, Urine: 68.1 ug/mL

## 2020-11-17 ENCOUNTER — Other Ambulatory Visit: Payer: Self-pay | Admitting: Family

## 2020-11-17 MED ORDER — TOUJEO SOLOSTAR 300 UNIT/ML ~~LOC~~ SOPN: 5.0000 [IU] | PEN_INJECTOR | Freq: Every evening | SUBCUTANEOUS | 3 refills | Status: DC

## 2020-11-17 NOTE — Telephone Encounter (Signed)
Pharmacy comment:  Alternative Requested: THE PRESCRIBED MEDICATION IS NOT COVERED BY INSURANCE. PLEASE CONSIDER CHANGING TO ONE OF THE SUGGESTED COVERED ALTERNATIVES.   All Pharmacy Suggested Alternatives:   0 Insulin Glargine (BASAGLAR KWIKPEN) 100 UNIT/ML 0 insulin degludec (TRESIBA FLEXTOUCH) 100 UNIT/ML FlexTouch Pen 0 insulin detemir (LEVEMIR FLEXTOUCH) 100 UNIT/ML FlexPen

## 2020-11-21 ENCOUNTER — Telehealth: Payer: Self-pay

## 2020-11-21 NOTE — Telephone Encounter (Signed)
Pt returned missed call from [facility] regarding referral. Made pt aware that she does not qualify to have CT Chest Scan done for lung cancer due to not meeting smoking requirements (per [facility] note). Pt voiced understanding.

## 2020-11-24 ENCOUNTER — Other Ambulatory Visit: Payer: Self-pay

## 2020-11-24 ENCOUNTER — Ambulatory Visit (INDEPENDENT_AMBULATORY_CARE_PROVIDER_SITE_OTHER): Payer: Medicare Other | Admitting: Pharmacist

## 2020-11-24 DIAGNOSIS — E119 Type 2 diabetes mellitus without complications: Secondary | ICD-10-CM | POA: Diagnosis not present

## 2020-11-24 MED ORDER — ONETOUCH VERIO VI STRP: ORAL_STRIP | 12 refills | Status: DC

## 2020-11-24 MED ORDER — ONETOUCH VERIO FLEX SYSTEM W/DEVICE KIT: PACK | 0 refills | Status: DC

## 2020-11-24 MED ORDER — ONETOUCH DELICA LANCETS 33G MISC: 11 refills | Status: AC

## 2020-11-24 NOTE — Progress Notes (Signed)
    11/24/2020 Name: Heidi Graham MRN: [id number] DOB: May 04, 1951   S:  61 yoF Presents for diabetes evaluation, education, and management Patient was referred and last seen by Primary Care Provider on 11/14/20.  Insurance coverage/medication affordability: [employer]  Patient reports adherence with medications. . Current diabetes medications include: tresiba, jardiance, metformin . Current hypertension medications include: quinapril, metop Goal 130/80 . Current hyperlipidemia medications include: rosuvastatin   Patient denies hypoglycemic events.   Patient reported dietary habits: Eats 2-3 meals/day Discussed meal planning options and Plate method for healthy eating . Avoid sugary drinks and desserts . Incorporate balanced protein, non starchy veggies, 1 serving of carbohydrate with each meal . Increase water intake . Increase physical activity as able  Patient-reported exercise habits: n/a  O:  Lab Results  Component Value Date   HGBA1C 11.5 (H) 11/14/2020   Lipid Panel     Component Value Date/Time   CHOL 197 11/14/2020 1025   TRIG 114 11/14/2020 1025   HDL 44 11/14/2020 1025   CHOLHDL 4.5 (H) 11/14/2020 1025   LDLCALC 132 (H) 11/14/2020 1025     Home fasting blood sugars: unable to check-hasn't gotten meter; BG in office 132  2 hour post-meal/random blood sugars: n/a.    A/P:  Diabetes T2DM currently uncontrolled.  Patient is adherent with medication.  -One touch verio system called in to [facility]-->demonstration & education provided  -Patient has not started insulin because she does not have pen needles (called in)  -Consider GLP1 at follow up, plan to stop insulin as able  -Continue current medications as prescribed; consider titrating jardiance to 25mg    -Extensively discussed pathophysiology of diabetes, recommended lifestyle interventions, dietary effects on blood sugar control  -Counseled on s/sx of and management of hypoglycemia  -Next  A1C anticipated 3 months  Written patient instructions provided.  Total time in face to face counseling 30 minutes.   Follow up PCP Clinic Visit ON 12/21/20.    02/18/21, PharmD, BCPS Clinical Pharmacist, [facility]  II Phone [phone]

## 2020-12-06 ENCOUNTER — Other Ambulatory Visit: Payer: Self-pay | Admitting: Family

## 2020-12-06 DIAGNOSIS — E1165 Type 2 diabetes mellitus with hyperglycemia: Secondary | ICD-10-CM

## 2020-12-21 ENCOUNTER — Encounter: Payer: Self-pay | Admitting: Family

## 2020-12-21 ENCOUNTER — Other Ambulatory Visit: Payer: Self-pay

## 2020-12-21 ENCOUNTER — Ambulatory Visit (INDEPENDENT_AMBULATORY_CARE_PROVIDER_SITE_OTHER): Payer: Medicare Other | Admitting: Family

## 2020-12-21 VITALS — BP 132/62 | HR 85 | Temp 97.0°F | Ht 63.0 in | Wt 155.8 lb

## 2020-12-21 DIAGNOSIS — F17200 Nicotine dependence, unspecified, uncomplicated: Secondary | ICD-10-CM

## 2020-12-21 DIAGNOSIS — I152 Hypertension secondary to endocrine disorders: Secondary | ICD-10-CM | POA: Diagnosis not present

## 2020-12-21 DIAGNOSIS — E1169 Type 2 diabetes mellitus with other specified complication: Secondary | ICD-10-CM

## 2020-12-21 DIAGNOSIS — E669 Obesity, unspecified: Secondary | ICD-10-CM | POA: Diagnosis not present

## 2020-12-21 DIAGNOSIS — E1159 Type 2 diabetes mellitus with other circulatory complications: Secondary | ICD-10-CM | POA: Diagnosis not present

## 2020-12-21 LAB — BMP8+EGFR
BUN/Creatinine Ratio: 23 (ref 12–28)
BUN: 16 mg/dL (ref 8–27)
CO2: 24 mmol/L (ref 20–29)
Calcium: 9.6 mg/dL (ref 8.7–10.3)
Chloride: 104 mmol/L (ref 96–106)
Creatinine, Ser: 0.7 mg/dL (ref 0.57–1.00)
GFR calc Af Amer: 102 mL/min/{1.73_m2} (ref >59)
GFR calc non Af Amer: 89 mL/min/{1.73_m2} (ref >59)
Glucose: 179 mg/dL — ABNORMAL HIGH (ref 65–99)
Potassium: 4.7 mmol/L (ref 3.5–5.2)
Sodium: 142 mmol/L (ref 134–144)

## 2020-12-21 MED ORDER — OZEMPIC (0.25 OR 0.5 MG/DOSE) 2 MG/1.5ML ~~LOC~~ SOPN: PEN_INJECTOR | SUBCUTANEOUS | 2 refills | Status: AC

## 2020-12-21 MED ORDER — METOPROLOL SUCCINATE ER 25 MG PO TB24: ORAL_TABLET | ORAL | 2 refills | Status: DC

## 2020-12-21 MED ORDER — EMPAGLIFLOZIN 25 MG PO TABS: 25.0000 mg | ORAL_TABLET | Freq: Every day | ORAL | 2 refills | Status: DC

## 2020-12-21 NOTE — Patient Instructions (Signed)

## 2020-12-21 NOTE — Progress Notes (Signed)
Subjective:    Patient ID: Heidi Graham, female    DOB: [birth year], 70 y.o.   MRN: [id number]  Chief Complaint  Patient presents with  . Diabetes    1 mth follow up    Pt presents to the office today for follow up with DM. She was seen on 11/14/20 and her A1C was 11.5. She saw the clinical pharmacists for diabetic education.    Diabetes She presents for her follow-up diabetic visit. She has type 2 diabetes mellitus. Her disease course has been worsening. There are no hypoglycemic associated symptoms. Pertinent negatives for diabetes include no blurred vision and no foot paresthesias. Symptoms are stable. Pertinent negatives for diabetic complications include no CVA or heart disease. Risk factors for coronary artery disease include dyslipidemia, diabetes mellitus, hypertension, post-menopausal and sedentary lifestyle.  Hypertension This is a chronic problem. The current episode started more than 1 year ago. The problem has been resolved since onset. Pertinent negatives include no blurred vision, malaise/fatigue, peripheral edema or shortness of breath. The current treatment provides moderate improvement. There is no history of CVA.      Review of Systems  Constitutional: Negative for malaise/fatigue.  Eyes: Negative for blurred vision.  Respiratory: Negative for shortness of breath.   All other systems reviewed and are negative.      Objective:   Physical Exam Vitals reviewed.  Constitutional:      General: She is not in acute distress.    Appearance: She is well-developed and well-nourished.  HENT:     Head: Normocephalic and atraumatic.     Mouth/Throat:     Mouth: Oropharynx is clear and moist.  Eyes:     Pupils: Pupils are equal, round, and reactive to light.  Neck:     Thyroid: No thyromegaly.  Cardiovascular:     Rate and Rhythm: Normal rate and regular rhythm.     Pulses: Intact distal pulses.     Heart sounds: Murmur heard.    Pulmonary:     Effort: Pulmonary  effort is normal. No respiratory distress.     Breath sounds: Normal breath sounds. No wheezing.  Abdominal:     General: Bowel sounds are normal. There is no distension.     Palpations: Abdomen is soft.     Tenderness: There is no abdominal tenderness.  Musculoskeletal:        General: No tenderness or edema. Normal range of motion.     Cervical back: Normal range of motion and neck supple.  Skin:    General: Skin is warm and dry.  Neurological:     Mental Status: She is alert and oriented to person, place, and time.     Cranial Nerves: No cranial nerve deficit.     Deep Tendon Reflexes: Reflexes are normal and symmetric.  Psychiatric:        Mood and Affect: Mood and affect normal.        Behavior: Behavior normal.        Thought Content: Thought content normal.        Judgment: Judgment normal.       BP 132/62   Pulse 85   Temp (!) 97 F (36.1 C) (Temporal)   Ht 5\' 3"  (1.6 m)   Wt 155 lb 12.8 oz (70.7 kg)   BMI 27.60 kg/m      Assessment & Plan:  JANYIA GUION comes in today with chief complaint of Diabetes (1 mth follow up )   Diagnosis and  orders addressed:  1. Hypertension associated with diabetes (HCC) - metoprolol succinate (TOPROL-XL) 25 MG 24 hr tablet; TAKE 1 TABLET BY MOUTH EVERY DAY  Dispense: 90 tablet; Refill: 2  2. Diabetes mellitus type 2 in obese (HCC) Will increase Jardiance to 25 mg from 10 mg  Will add Ozempic 0.25 mg  Will stop Basaglar today Strict low carb RTO in 1 month - empagliflozin (JARDIANCE) 25 MG TABS tablet; Take 1 tablet (25 mg total) by mouth daily before breakfast.  Dispense: 90 tablet; Refill: 2 - Semaglutide,0.25 or 0.5MG /DOS, (OZEMPIC, 0.25 OR 0.5 MG/DOSE,) 2 MG/1.5ML SOPN; Inject 0.25 mg into the muscle once a week for 28 days, THEN 0.5 mg once a week for 28 days.  Dispense: 2.25 mL; Refill: 2  3. Current smoker   Labs pending Health Maintenance reviewed Diet and exercise encouraged  Follow up plan: 1 month    Jannifer Rodney, FNP

## 2021-01-02 ENCOUNTER — Other Ambulatory Visit: Payer: Self-pay | Admitting: Family

## 2021-01-02 DIAGNOSIS — E1165 Type 2 diabetes mellitus with hyperglycemia: Secondary | ICD-10-CM

## 2021-01-22 ENCOUNTER — Ambulatory Visit: Payer: Medicare Other | Admitting: Family

## 2021-01-23 ENCOUNTER — Other Ambulatory Visit: Payer: Self-pay

## 2021-01-23 ENCOUNTER — Ambulatory Visit (INDEPENDENT_AMBULATORY_CARE_PROVIDER_SITE_OTHER): Payer: Medicare Other | Admitting: Family

## 2021-01-23 ENCOUNTER — Encounter: Payer: Self-pay | Admitting: Family

## 2021-01-23 VITALS — BP 123/61 | HR 73 | Temp 97.1°F | Ht 63.0 in | Wt 149.6 lb

## 2021-01-23 DIAGNOSIS — E1169 Type 2 diabetes mellitus with other specified complication: Secondary | ICD-10-CM

## 2021-01-23 DIAGNOSIS — E785 Hyperlipidemia, unspecified: Secondary | ICD-10-CM

## 2021-01-23 DIAGNOSIS — E669 Obesity, unspecified: Secondary | ICD-10-CM

## 2021-01-23 DIAGNOSIS — E1159 Type 2 diabetes mellitus with other circulatory complications: Secondary | ICD-10-CM | POA: Diagnosis not present

## 2021-01-23 DIAGNOSIS — I25709 Atherosclerosis of coronary artery bypass graft(s), unspecified, with unspecified angina pectoris: Secondary | ICD-10-CM

## 2021-01-23 DIAGNOSIS — F17200 Nicotine dependence, unspecified, uncomplicated: Secondary | ICD-10-CM

## 2021-01-23 DIAGNOSIS — E119 Type 2 diabetes mellitus without complications: Secondary | ICD-10-CM

## 2021-01-23 DIAGNOSIS — Z951 Presence of aortocoronary bypass graft: Secondary | ICD-10-CM | POA: Diagnosis not present

## 2021-01-23 DIAGNOSIS — I152 Hypertension secondary to endocrine disorders: Secondary | ICD-10-CM | POA: Diagnosis not present

## 2021-01-23 LAB — CMP14+EGFR
ALT: 11 IU/L (ref 0–32)
AST: 16 IU/L (ref 0–40)
Albumin/Globulin Ratio: 2.1 (ref 1.2–2.2)
Albumin: 4.5 g/dL (ref 3.8–4.8)
Alkaline Phosphatase: 69 IU/L (ref 44–121)
BUN/Creatinine Ratio: 13 (ref 12–28)
BUN: 9 mg/dL (ref 8–27)
Bilirubin Total: 0.4 mg/dL (ref 0.0–1.2)
CO2: 23 mmol/L (ref 20–29)
Calcium: 9.7 mg/dL (ref 8.7–10.3)
Chloride: 105 mmol/L (ref 96–106)
Creatinine, Ser: 0.69 mg/dL (ref 0.57–1.00)
GFR calc Af Amer: 103 mL/min/{1.73_m2} (ref >59)
GFR calc non Af Amer: 89 mL/min/{1.73_m2} (ref >59)
Globulin, Total: 2.1 g/dL (ref 1.5–4.5)
Glucose: 159 mg/dL — ABNORMAL HIGH (ref 65–99)
Potassium: 4 mmol/L (ref 3.5–5.2)
Sodium: 143 mmol/L (ref 134–144)
Total Protein: 6.6 g/dL (ref 6.0–8.5)

## 2021-01-23 LAB — CBC WITH DIFFERENTIAL/PLATELET
Basophils Absolute: 0 10*3/uL (ref 0.0–0.2)
Basos: 1 %
EOS (ABSOLUTE): 0 10*3/uL (ref 0.0–0.4)
Eos: 1 %
Hematocrit: 40.9 % (ref 34.0–46.6)
Hemoglobin: 13.5 g/dL (ref 11.1–15.9)
Immature Grans (Abs): 0 10*3/uL (ref 0.0–0.1)
Immature Granulocytes: 0 %
Lymphocytes Absolute: 1.9 10*3/uL (ref 0.7–3.1)
Lymphs: 33 %
MCH: 30.8 pg (ref 26.6–33.0)
MCHC: 33 g/dL (ref 31.5–35.7)
MCV: 93 fL (ref 79–97)
Monocytes Absolute: 0.3 10*3/uL (ref 0.1–0.9)
Monocytes: 6 %
Neutrophils Absolute: 3.4 10*3/uL (ref 1.4–7.0)
Neutrophils: 59 %
Platelets: 177 10*3/uL (ref 150–450)
RBC: 4.38 x10E6/uL (ref 3.77–5.28)
RDW: 12.6 % (ref 11.7–15.4)
WBC: 5.6 10*3/uL (ref 3.4–10.8)

## 2021-01-23 LAB — BAYER DCA HB A1C WAIVED: HB A1C (BAYER DCA - WAIVED): 7.4 % — ABNORMAL HIGH (ref <7.0)

## 2021-01-23 NOTE — Progress Notes (Signed)
Subjective:    Patient ID: Heidi Graham, female    DOB: 16-Aug-1951, 70 y.o.   MRN: [id number]  Chief Complaint  Patient presents with  . Diabetes   Pt presents to the office today for chronic follow up.  PT had CABG in 2011 and 2017 is followed by Cardiologists annually. Diabetes She presents for her follow-up diabetic visit. She has type 2 diabetes mellitus. Her disease course has been stable. There are no hypoglycemic associated symptoms. Pertinent negatives for diabetes include no blurred vision and no foot paresthesias. There are no hypoglycemic complications. Symptoms are stable. Diabetic complications include heart disease. Pertinent negatives for diabetic complications include no nephropathy or peripheral neuropathy. Risk factors for coronary artery disease include dyslipidemia, diabetes mellitus, hypertension, sedentary lifestyle, post-menopausal, stress and tobacco exposure. She is following a generally unhealthy diet. Her overall blood glucose range is 130-140 mg/dl. An ACE inhibitor/angiotensin II receptor blocker is being taken.  Hypertension This is a chronic problem. The current episode started more than 1 year ago. The problem has been resolved since onset. The problem is controlled. Associated symptoms include malaise/fatigue. Pertinent negatives include no blurred vision, peripheral edema or shortness of breath. Risk factors for coronary artery disease include dyslipidemia, diabetes mellitus, obesity, sedentary lifestyle and smoking/tobacco exposure. The current treatment provides moderate improvement. There is no history of heart failure.  Hyperlipidemia This is a chronic problem. The current episode started more than 1 year ago. The problem is controlled. Pertinent negatives include no shortness of breath. Current antihyperlipidemic treatment includes statins. The current treatment provides moderate improvement of lipids. Risk factors for coronary artery disease include  dyslipidemia, diabetes mellitus, hypertension, a sedentary lifestyle and post-menopausal.  Nicotine Dependence Presents for follow-up visit. Her urge triggers include company of smokers. The symptoms have been stable. She smokes < 1/2 a pack of cigarettes per day.      Review of Systems  Constitutional: Positive for malaise/fatigue.  Eyes: Negative for blurred vision.  Respiratory: Negative for shortness of breath.        Objective:   Physical Exam Vitals reviewed.  Constitutional:      General: She is not in acute distress.    Appearance: She is well-developed and well-nourished.  HENT:     Head: Normocephalic and atraumatic.     Right Ear: Tympanic membrane normal.     Left Ear: Tympanic membrane normal.     Mouth/Throat:     Mouth: Oropharynx is clear and moist.  Eyes:     Pupils: Pupils are equal, round, and reactive to light.  Neck:     Thyroid: No thyromegaly.  Cardiovascular:     Rate and Rhythm: Normal rate and regular rhythm.     Pulses: Intact distal pulses.     Heart sounds: Murmur heard.    Pulmonary:     Effort: Pulmonary effort is normal. No respiratory distress.     Breath sounds: Normal breath sounds. No wheezing.  Abdominal:     General: Bowel sounds are normal. There is no distension.     Palpations: Abdomen is soft.     Tenderness: There is no abdominal tenderness.  Musculoskeletal:        General: No tenderness or edema. Normal range of motion.     Cervical back: Normal range of motion and neck supple.  Skin:    General: Skin is warm and dry.  Neurological:     Mental Status: She is alert and oriented to person, place, and time.  Cranial Nerves: No cranial nerve deficit.     Deep Tendon Reflexes: Reflexes are normal and symmetric.  Psychiatric:        Mood and Affect: Mood and affect normal.        Behavior: Behavior normal.        Thought Content: Thought content normal.        Judgment: Judgment normal.          BP 123/61    Pulse 73   Temp (!) 97.1 F (36.2 C) (Temporal)   Ht _0  (1.6 m)   Wt 149 lb 9.6 oz (67.9 kg)   SpO2 99%   BMI 26.50 kg/m   Assessment & Plan:  Heidi Graham comes in today with chief complaint of Diabetes   Diagnosis and orders addressed:  1. Hypertension associated with diabetes ([place]) - CMP14+EGFR - CBC with Differential/Platelet  2. Atherosclerosis of coronary artery bypass graft with angina pectoris, unspecified whether native or transplanted heart ([place]) - CMP14+EGFR - CBC with Differential/Platelet  3. Hyperlipidemia associated with type 2 diabetes mellitus (HCC) - CMP14+EGFR - CBC with Differential/Platelet  4. Diabetes mellitus type 2 in obese (HCC) - Bayer DCA Hb A1c Waived - CMP14+EGFR - CBC with Differential/Platelet  5. Hx of CABG - CMP14+EGFR - CBC with Differential/Platelet  6. Current smoker - CMP14+EGFR - CBC with Differential/Platelet  7. Hyperlipidemia, unspecified hyperlipidemia type  - CMP14+EGFR - CBC with Differential/Platelet   Labs pending Health Maintenance reviewed Diet and exercise encouraged  Follow up plan: 3 months    Heidi Dun, FNP

## 2021-01-23 NOTE — Patient Instructions (Signed)
Diabetes Mellitus and Nutrition, Adult When you have diabetes, or diabetes mellitus, it is very important to have healthy eating habits because your blood sugar (glucose) levels are greatly affected by what you eat and drink. Eating healthy foods in the right amounts, at about the same times every day, can help you:  Control your blood glucose.  Lower your risk of heart disease.  Improve your blood pressure.  Reach or maintain a healthy weight. What can affect my meal plan? Every person with diabetes is different, and each person has different needs for a meal plan. Your health care provider may recommend that you work with a dietitian to make a meal plan that is best for you. Your meal plan may vary depending on factors such as:  The calories you need.  The medicines you take.  Your weight.  Your blood glucose, blood pressure, and cholesterol levels.  Your activity level.  Other health conditions you have, such as heart or kidney disease. How do carbohydrates affect me? Carbohydrates, also called carbs, affect your blood glucose level more than any other type of food. Eating carbs naturally raises the amount of glucose in your blood. Carb counting is a method for keeping track of how many carbs you eat. Counting carbs is important to keep your blood glucose at a healthy level, especially if you use insulin or take certain oral diabetes medicines. It is important to know how many carbs you can safely have in each meal. This is different for every person. Your dietitian can help you calculate how many carbs you should have at each meal and for each snack. How does alcohol affect me? Alcohol can cause a sudden decrease in blood glucose (hypoglycemia), especially if you use insulin or take certain oral diabetes medicines. Hypoglycemia can be a life-threatening condition. Symptoms of hypoglycemia, such as sleepiness, dizziness, and confusion, are similar to symptoms of having too much  alcohol.  Do not drink alcohol if: ? Your health care provider tells you not to drink. ? You are pregnant, may be pregnant, or are planning to become pregnant.  If you drink alcohol: ? Do not drink on an empty stomach. ? Limit how much you use to:  0-1 drink a day for women.  0-2 drinks a day for men. ? Be aware of how much alcohol is in your drink. In the [place] one drink equals one 12 oz bottle of beer (355 mL), one 5 oz glass of wine (148 mL), or one 1 oz glass of hard liquor (44 mL). ? Keep yourself hydrated with water, diet soda, or unsweetened iced tea.  Keep in mind that regular soda, juice, and other mixers may contain a lot of sugar and must be counted as carbs. What are tips for following this plan? Reading food labels  Start by checking the serving size on the "Nutrition Facts" label of packaged foods and drinks. The amount of calories, carbs, fats, and other nutrients listed on the label is based on one serving of the item. Many items contain more than one serving per package.  Check the total grams (g) of carbs in one serving. You can calculate the number of servings of carbs in one serving by dividing the total carbs by 15. For example, if a food has 30 g of total carbs per serving, it would be equal to 2 servings of carbs.  Check the number of grams (g) of saturated fats and trans fats in one serving. Choose foods that have   a low amount or none of these fats.  Check the number of milligrams (mg) of salt (sodium) in one serving. Most people should limit total sodium intake to less than 2,300 mg per day.  Always check the nutrition information of foods labeled as "low-fat" or "nonfat." These foods may be higher in added sugar or refined carbs and should be avoided.  Talk to your dietitian to identify your daily goals for nutrients listed on the label. Shopping  Avoid buying canned, pre-made, or processed foods. These foods tend to be high in fat, sodium, and added  sugar.  Shop around the outside edge of the grocery store. This is where you will most often find fresh fruits and vegetables, bulk grains, fresh meats, and fresh dairy. Cooking  Use low-heat cooking methods, such as baking, instead of high-heat cooking methods like deep frying.  Cook using healthy oils, such as olive, canola, or sunflower oil.  Avoid cooking with butter, cream, or high-fat meats. Meal planning  Eat meals and snacks regularly, preferably at the same times every day. Avoid going long periods of time without eating.  Eat foods that are high in fiber, such as fresh fruits, vegetables, beans, and whole grains. Talk with your dietitian about how many servings of carbs you can eat at each meal.  Eat 4-6 oz (112-168 g) of lean protein each day, such as lean meat, chicken, fish, eggs, or tofu. One ounce (oz) of lean protein is equal to: ? 1 oz (28 g) of meat, chicken, or fish. ? 1 egg. ?  cup (62 g) of tofu.  Eat some foods each day that contain healthy fats, such as avocado, nuts, seeds, and fish.   What foods should I eat? Fruits Berries. Apples. Oranges. Peaches. Apricots. Plums. Grapes. Mango. Papaya. Pomegranate. Kiwi. Cherries. Vegetables Lettuce. Spinach. Leafy greens, including kale, chard, collard greens, and mustard greens. Beets. Cauliflower. Cabbage. Broccoli. Carrots. Green beans. Tomatoes. Peppers. Onions. Cucumbers. Brussels sprouts. Grains Whole grains, such as whole-wheat or whole-grain bread, crackers, tortillas, cereal, and pasta. Unsweetened oatmeal. Quinoa. Brown or wild rice. Meats and other proteins Seafood. Poultry without skin. Lean cuts of poultry and beef. Tofu. Nuts. Seeds. Dairy Low-fat or fat-free dairy products such as milk, yogurt, and cheese. The items listed above may not be a complete list of foods and beverages you can eat. Contact a dietitian for more information. What foods should I avoid? Fruits Fruits canned with  syrup. Vegetables Canned vegetables. Frozen vegetables with butter or cream sauce. Grains Refined white flour and flour products such as bread, pasta, snack foods, and cereals. Avoid all processed foods. Meats and other proteins Fatty cuts of meat. Poultry with skin. Breaded or fried meats. Processed meat. Avoid saturated fats. Dairy Full-fat yogurt, cheese, or milk. Beverages Sweetened drinks, such as soda or iced tea. The items listed above may not be a complete list of foods and beverages you should avoid. Contact a dietitian for more information. Questions to ask a health care provider  Do I need to meet with a diabetes educator?  Do I need to meet with a dietitian?  What number can I call if I have questions?  When are the best times to check my blood glucose? Where to find more information:  American Diabetes Association: diabetes.org  Academy of Nutrition and Dietetics: www.eatright.org  National Institute of Diabetes and Digestive and Kidney Diseases: www.niddk.nih.gov  Association of Diabetes Care and Education Specialists: www.diabeteseducator.org Summary  It is important to have healthy eating   habits because your blood sugar (glucose) levels are greatly affected by what you eat and drink.  A healthy meal plan will help you control your blood glucose and maintain a healthy lifestyle.  Your health care provider may recommend that you work with a dietitian to make a meal plan that is best for you.  Keep in mind that carbohydrates (carbs) and alcohol have immediate effects on your blood glucose levels. It is important to count carbs and to use alcohol carefully. This information is not intended to replace advice given to you by your health care provider. Make sure you discuss any questions you have with your health care provider. Document Revised: 11/09/2019 Document Reviewed: 11/09/2019 Elsevier Patient Education  2021 Elsevier Inc.  

## 2021-01-27 ENCOUNTER — Other Ambulatory Visit: Payer: Self-pay | Admitting: Family

## 2021-01-27 DIAGNOSIS — E1165 Type 2 diabetes mellitus with hyperglycemia: Secondary | ICD-10-CM

## 2021-01-31 ENCOUNTER — Ambulatory Visit (INDEPENDENT_AMBULATORY_CARE_PROVIDER_SITE_OTHER): Payer: Medicare Other | Admitting: *Deleted

## 2021-01-31 VITALS — BP 130/78 | Wt 149.0 lb

## 2021-01-31 DIAGNOSIS — Z0000 Encounter for general adult medical examination without abnormal findings: Secondary | ICD-10-CM | POA: Diagnosis not present

## 2021-01-31 NOTE — Patient Instructions (Signed)
  Heidi Graham , Thank you for taking time to come for your Medicare Wellness Visit. I appreciate your ongoing commitment to your health goals. Please review the following plan we discussed and let me know if I can assist you in the future.   These are the goals we discussed: Goals    . Quit Smoking     Pick quit date and work on reducing cigarettes until you have quit smoking.       This is a list of the screening recommended for you and due dates:  Health Maintenance  Topic Date Due  . Eye exam for diabetics  08/10/2020  . COVID-19 Vaccine (1) 02/08/2021*  . Mammogram  12/21/2021*  . Hemoglobin A1C  07/23/2021  . Complete foot exam   11/14/2021  . Colon Cancer Screening  06/29/2022  . Tetanus Vaccine  11/20/2030  . Flu Shot  Completed  . DEXA scan (bone density measurement)  Completed  .  Hepatitis C: One time screening is recommended by Center for Disease Control  (CDC) for  adults born from [birth year] through 1965.   Completed  . Pneumonia vaccines  Completed  *Topic was postponed. The date shown is not the original due date.

## 2021-01-31 NOTE — Progress Notes (Signed)
MEDICARE ANNUAL WELLNESS VISIT  01/31/2021  Telephone Visit Disclaimer This Medicare AWV was conducted by telephone due to national recommendations for restrictions regarding the COVID-19 Pandemic (e.g. social distancing).  I verified, using two identifiers, that I am speaking with Heidi Graham or their authorized healthcare agent. I discussed the limitations, risks, security, and privacy concerns of performing an evaluation and management service by telephone and the potential availability of an in-person appointment in the future. The patient expressed understanding and agreed to proceed.  Location of Patient: in her home Location of Provider (nurse):  In office  Subjective:    Heidi Graham is a 70 y.o. female patient of Hawks, Theador Hawthorne, FNP who had a Medicare Annual Wellness Visit today via telephone. Melayah is Retired and lives with their family. she has 3 children living, 1 passed away as a baby. she reports that she is socially active and does interact with friends/family regularly. she is minimally physically active and enjoys TV, spending time with her grandchildren and puzzles.  Patient Care Team: Sharion Balloon, FNP as PCP - General (Nurse Practitioner) Remer Macho, Larene Beach, MD as Referring Physician (Internal Medicine)  Advanced Directives 01/31/2021 08/18/2018 01/21/2017 12/11/2016  Does Patient Have a Medical Advance Directive? No No No No  Would patient like information on creating a medical advance directive? No - Patient declined Yes (MAU/Ambulatory/Procedural Areas - Information given) Yes (MAU/Ambulatory/Procedural Areas - Information given) No - Patient declined    Hospital Utilization Over the Past 12 Months: # of hospitalizations or ER visits: 0 # of surgeries: 0  Review of Systems    Patient reports that her overall health is better compared to last year.  General ROS: negative  Patient Reported Readings (BP, Pulse, CBG, Weight, etc) BP 130/78   Wt 149  lb (67.6 kg)   BMI 26.39 kg/m    Pain Assessment       Current Medications & Allergies (verified) Allergies as of 01/31/2021      Reactions   Codeine Hives      Medication List       Accurate as of January 31, 2021 11:22 AM. If you have any questions, ask your nurse or doctor.        aspirin EC 81 MG tablet Take 1 tablet (81 mg total) by mouth daily. Swallow whole.   empagliflozin 25 MG Tabs tablet Commonly known as: Jardiance Take 1 tablet (25 mg total) by mouth daily before breakfast.   isosorbide mononitrate 30 MG 24 hr tablet Commonly known as: IMDUR Take 1 tablet (30 mg total) by mouth daily.   metFORMIN 750 MG 24 hr tablet Commonly known as: GLUCOPHAGE-XR TAKE 2 TABLETS (1,500 MG TOTAL) BY MOUTH DAILY WITH BREAKFAST.   metoprolol succinate 25 MG 24 hr tablet Commonly known as: TOPROL-XL TAKE 1 TABLET BY MOUTH EVERY DAY   nitroGLYCERIN 0.4 MG SL tablet Commonly known as: NITROSTAT Place 0.4 mg under the tongue every 5 (five) minutes as needed for chest pain.   OneTouch Delica Lancets 28U Misc Use to test blood sugar two times daily as directed. DX:E11.9   OneTouch Verio Flex System w/Device Kit Use to test blood sugar two times daily as directed. DX:E11.9   OneTouch Verio test strip Generic drug: glucose blood Use to test blood sugar two times daily as directed. DX:E11.9   Ozempic (0.25 or 0.5 MG/DOSE) 2 MG/1.5ML Sopn Generic drug: Semaglutide(0.25 or 0.5MG/DOS) Inject 0.25 mg into the muscle once a week for  28 days, THEN 0.5 mg once a week for 28 days. Start taking on: December 21, 2020   quinapril 10 MG tablet Commonly known as: ACCUPRIL Take 1 tablet (10 mg total) by mouth at bedtime. TAKE 1 TABLET (10 MG TOTAL) BY MOUTH DAILY.   rosuvastatin 10 MG tablet Commonly known as: CRESTOR Take 1 tablet (10 mg total) by mouth at bedtime.   Thera Tabs Take by mouth.       History (reviewed): Past Medical History:  Diagnosis Date  . Arthritis    . Cataract   . Diabetes mellitus without complication ([place])   . Hyperlipidemia   . Hypertension   . Myocardial infarction [facility]) 2011   Past Surgical History:  Procedure Laterality Date  . CARDIAC SURGERY     Triple  Bypass  . CESAREAN SECTION     two  . CORONARY ARTERY BYPASS GRAFT    . HERNIA REPAIR     umbilical   Family History  Problem Relation Age of Onset  . Diabetes Mother   . Hypertension Mother   . Diabetes Daughter   . Diabetes Son   . Diabetes Sister        type 2  . Hypertension Sister   . Diabetes Brother        type 1   Social History   Socioeconomic History  . Marital status: Widowed    Spouse name: Not on file  . Number of children: 3  . Years of education: Not on file  . Highest education level: Not on file  Occupational History  . Occupation: retired   Tobacco Use  . Smoking status: Current Every Day Smoker    Packs/day: 0.30    Years: 28.00    Pack years: 8.40    Types: Cigarettes  . Smokeless tobacco: Never Used  . Tobacco comment: smoked for 20 years - quit for 9 years and restarted about 6 yeasr ago.  Vaping Use  . Vaping Use: Never used  Substance and Sexual Activity  . Alcohol use: No  . Drug use: No  . Sexual activity: Never  Other Topics Concern  . Not on file  Social History Narrative   Lives with daughter and 2 grandchildren    Social Determinants of Health   Financial Resource Strain: Not on file  Food Insecurity: Not on file  Transportation Needs: Not on file  Physical Activity: Not on file  Stress: Not on file  Social Connections: Not on file    Activities of Daily Living In your present state of health, do you have any difficulty performing the following activities: 01/31/2021  Hearing? N  Vision? Y  Comment rx glasses  Difficulty concentrating or making decisions? N  Walking or climbing stairs? N  Dressing or bathing? N  Doing errands, shopping? N  Preparing Food and eating ? N  Using the Toilet? N  In the  past six months, have you accidently leaked urine? N  Do you have problems with loss of bowel control? N  Managing your Medications? N  Managing your Finances? N  Housekeeping or managing your Housekeeping? N  Some recent data might be hidden    Patient Education/ Literacy    Exercise Current Exercise Habits: Home exercise routine, Type of exercise: walking, Time (Minutes): 30, Frequency (Times/Week): 7, Weekly Exercise (Minutes/Week): 210, Intensity: Mild, Exercise limited by: None identified  Diet Patient reports consuming 1 meals a day and 2 snack(s) a day Patient reports that her primary diet  is: Regular Patient reports that she does have regular access to food.   Depression Screen PHQ 2/9 Scores 01/31/2021 01/23/2021 12/21/2020 11/14/2020 09/28/2018 08/18/2018 10/21/2017  PHQ - 2 Score 0 0 0 0 0 0 0  PHQ- 9 Score - - - 5 - - -     Fall Risk Fall Risk  01/31/2021 01/23/2021 12/21/2020 11/14/2020 11/10/2019  Falls in the past year? 1 1 0 0 0  Comment - - - - Emmi Telephone Survey: data to providers prior to load  Number falls in past yr: 0 0 - - -  Injury with Fall? 1 1 - - -  Risk for fall due to : History of fall(s) Impaired balance/gait - - -  Follow up Falls evaluation completed Education provided - - -     Objective:  Heidi Graham seemed alert and oriented and she participated appropriately during our telephone visit.  Blood Pressure Weight BMI  BP Readings from Last 3 Encounters:  01/31/21 130/78  01/23/21 123/61  12/21/20 132/62   Wt Readings from Last 3 Encounters:  01/31/21 149 lb (67.6 kg)  01/23/21 149 lb 9.6 oz (67.9 kg)  12/21/20 155 lb 12.8 oz (70.7 kg)   BMI Readings from Last 1 Encounters:  01/31/21 26.39 kg/m    *Unable to obtain current vital signs, weight, and BMI due to telephone visit type  Hearing/Vision  . Rebbeca did not seem to have difficulty with hearing/understanding during the telephone conversation . Reports that she has not had a formal  eye exam by an eye care professional within the past year . Reports that she has not had a formal hearing evaluation within the past year *Unable to fully assess hearing and vision during telephone visit type  Cognitive Function: 6CIT Screen 01/31/2021  What Year? 0 points  What month? 0 points  What time? 0 points  Count back from 20 0 points  Months in reverse 0 points  Repeat phrase 0 points  Total Score 0   (Normal:0-7, Significant for Dysfunction: >8)  Normal Cognitive Function Screening: Yes   Immunization & Health Maintenance Record Immunization History  Administered Date(s) Administered  . Influenza, High Dose Seasonal PF 10/08/2017, 11/09/2018, 09/07/2019  . Influenza, Seasonal, Injecte, Preservative Fre 11/17/2014  . Influenza,inj,Quad PF,6+ Mos 11/17/2014, 10/19/2015  . Influenza-Unspecified 10/15/2017, 10/05/2020  . Pneumococcal Polysaccharide-23 08/18/2018  . Tdap 11/20/2020    Health Maintenance  Topic Date Due  . OPHTHALMOLOGY EXAM  08/10/2020  . COVID-19 Vaccine (1) 02/08/2021 (Originally 02/22/1956)  . MAMMOGRAM  12/21/2021 (Originally 01/23/2020)  . HEMOGLOBIN A1C  07/23/2021  . FOOT EXAM  11/14/2021  . COLONOSCOPY (Pts 45-67yr Insurance coverage will need to be confirmed)  06/29/2022  . TETANUS/TDAP  11/20/2030  . INFLUENZA VACCINE  Completed  . DEXA SCAN  Completed  . Hepatitis C Screening  Completed  . PNA vac Low Risk Adult  Completed       Assessment  This is a routine wellness examination for DKAYANN MAJ  Health Maintenance: Due or Overdue Health Maintenance Due  Topic Date Due  . OPHTHALMOLOGY EXAM  08/10/2020    DRosario Adiedoes not need a referral for Community Assistance: Care Management:   no Social Work:    no Prescription Assistance:  no Nutrition/Diabetes Education:  no   Plan:  Personalized Goals Goals Addressed            This Visit's Progress   . Quit Smoking   On track  Pick quit date and work on reducing  cigarettes until you have quit smoking.      Personalized Health Maintenance & Screening Recommendations  eye appt is due, she will schedule this  Lung Cancer Screening Recommended: no (Low Dose CT Chest recommended if Age 39-80 years, 30 pack-year currently smoking OR have quit w/in past 15 years) Hepatitis C Screening recommended: no HIV Screening recommended: no  Advanced Directives: Written information was not prepared per patient's request.  Referrals & Orders No orders of the defined types were placed in this encounter.   Follow-up Plan . Follow-up with Sharion Balloon, FNP as planned    I have personally reviewed and noted the following in the patient's chart:   . Medical and social history . Use of alcohol, tobacco or illicit drugs  . Current medications and supplements . Functional ability and status . Nutritional status . Physical activity . Advanced directives . List of other physicians . Hospitalizations, surgeries, and ER visits in previous 12 months . Vitals . Screenings to include cognitive, depression, and falls . Referrals and appointments  In addition, I have reviewed and discussed with Heidi Graham certain preventive protocols, quality metrics, and best practice recommendations. A written personalized care plan for preventive services as well as general preventive health recommendations is available and can be mailed to the patient at her request.      [place] Dec  01/31/2021

## 2021-02-04 ENCOUNTER — Other Ambulatory Visit: Payer: Self-pay | Admitting: Family

## 2021-02-04 DIAGNOSIS — E1169 Type 2 diabetes mellitus with other specified complication: Secondary | ICD-10-CM

## 2021-02-04 DIAGNOSIS — E1165 Type 2 diabetes mellitus with hyperglycemia: Secondary | ICD-10-CM

## 2021-02-04 DIAGNOSIS — I152 Hypertension secondary to endocrine disorders: Secondary | ICD-10-CM

## 2021-02-04 DIAGNOSIS — E785 Hyperlipidemia, unspecified: Secondary | ICD-10-CM

## 2021-02-04 DIAGNOSIS — E1159 Type 2 diabetes mellitus with other circulatory complications: Secondary | ICD-10-CM

## 2021-03-15 ENCOUNTER — Other Ambulatory Visit: Payer: Self-pay | Admitting: Family

## 2021-03-15 DIAGNOSIS — Z1231 Encounter for screening mammogram for malignant neoplasm of breast: Secondary | ICD-10-CM

## 2021-04-02 ENCOUNTER — Telehealth: Payer: Self-pay

## 2021-04-02 MED ORDER — OZEMPIC (1 MG/DOSE) 4 MG/3ML ~~LOC~~ SOPN: 1.0000 mg | PEN_INJECTOR | SUBCUTANEOUS | 2 refills | Status: DC

## 2021-04-02 NOTE — Telephone Encounter (Signed)
Prescription sent to pharmacy.

## 2021-04-02 NOTE — Telephone Encounter (Signed)
  Prescription Request  04/02/2021  What is the name of the medication or equipment? ozempic  Have you contacted your pharmacy to request a refill? (if applicable) yes, pt has been trying for a week to get this  Which pharmacy would you like this sent to? [facility]   Patient notified that their request is being sent to the clinical staff for review and that they should receive a response within 2 business days.

## 2021-04-02 NOTE — Telephone Encounter (Signed)
Ozempic is not on patient's current med list.  There is a mention of her taking it in your 12/21/20 office visit notes and then in the labs from 11/17/20 there is a mention of patient taking 5 units of Toujeo daily.

## 2021-04-03 ENCOUNTER — Telehealth: Payer: Self-pay

## 2021-04-03 ENCOUNTER — Other Ambulatory Visit: Payer: Self-pay | Admitting: Family Medicine

## 2021-04-03 MED ORDER — OZEMPIC (0.25 OR 0.5 MG/DOSE) 2 MG/1.5ML ~~LOC~~ SOPN: 0.5000 mg | PEN_INJECTOR | SUBCUTANEOUS | 11 refills | Status: DC

## 2021-04-03 NOTE — Telephone Encounter (Signed)
Called patient she states that the ozempic 0.5 she done okay on but the 1 makes her sick to her stomach please advise. [facility] uses [facility]. She wants to go back down.

## 2021-04-03 NOTE — Telephone Encounter (Signed)
Ok to decrease Ozempic  to 0.5 mg

## 2021-04-03 NOTE — Telephone Encounter (Signed)
Pt wants to know why Heidi Graham changed the dosage of her insulin. Says she was taking 0.5 units and now says she is supposed to take 1.0 unit.. pt says she cant take 1.0 units because it makes her sick and she cant eat.  Please advise and call patient.

## 2021-04-20 ENCOUNTER — Other Ambulatory Visit: Payer: Self-pay

## 2021-04-23 ENCOUNTER — Other Ambulatory Visit: Payer: Self-pay | Admitting: Family

## 2021-04-23 DIAGNOSIS — E1165 Type 2 diabetes mellitus with hyperglycemia: Secondary | ICD-10-CM

## 2021-04-24 ENCOUNTER — Encounter: Payer: Self-pay | Admitting: Family Medicine

## 2021-04-26 ENCOUNTER — Ambulatory Visit (INDEPENDENT_AMBULATORY_CARE_PROVIDER_SITE_OTHER): Payer: Medicare Other | Admitting: Family

## 2021-04-26 ENCOUNTER — Encounter: Payer: Self-pay | Admitting: Family

## 2021-04-26 ENCOUNTER — Other Ambulatory Visit: Payer: Self-pay

## 2021-04-26 VITALS — BP 131/69 | HR 73 | Temp 97.0°F | Ht 63.0 in | Wt 142.0 lb

## 2021-04-26 DIAGNOSIS — Z951 Presence of aortocoronary bypass graft: Secondary | ICD-10-CM | POA: Diagnosis not present

## 2021-04-26 DIAGNOSIS — E1169 Type 2 diabetes mellitus with other specified complication: Secondary | ICD-10-CM | POA: Diagnosis not present

## 2021-04-26 DIAGNOSIS — E1159 Type 2 diabetes mellitus with other circulatory complications: Secondary | ICD-10-CM | POA: Diagnosis not present

## 2021-04-26 DIAGNOSIS — E785 Hyperlipidemia, unspecified: Secondary | ICD-10-CM

## 2021-04-26 DIAGNOSIS — I25709 Atherosclerosis of coronary artery bypass graft(s), unspecified, with unspecified angina pectoris: Secondary | ICD-10-CM | POA: Diagnosis not present

## 2021-04-26 DIAGNOSIS — I152 Hypertension secondary to endocrine disorders: Secondary | ICD-10-CM

## 2021-04-26 DIAGNOSIS — E669 Obesity, unspecified: Secondary | ICD-10-CM

## 2021-04-26 DIAGNOSIS — F17200 Nicotine dependence, unspecified, uncomplicated: Secondary | ICD-10-CM

## 2021-04-26 LAB — CBC WITH DIFFERENTIAL/PLATELET
Basophils Absolute: 0 10*3/uL (ref 0.0–0.2)
Basos: 1 %
EOS (ABSOLUTE): 0.1 10*3/uL (ref 0.0–0.4)
Eos: 2 %
Hematocrit: 41.4 % (ref 34.0–46.6)
Hemoglobin: 13.6 g/dL (ref 11.1–15.9)
Immature Grans (Abs): 0 10*3/uL (ref 0.0–0.1)
Immature Granulocytes: 0 %
Lymphocytes Absolute: 2.1 10*3/uL (ref 0.7–3.1)
Lymphs: 30 %
MCH: 30.5 pg (ref 26.6–33.0)
MCHC: 32.9 g/dL (ref 31.5–35.7)
MCV: 93 fL (ref 79–97)
Monocytes Absolute: 0.4 10*3/uL (ref 0.1–0.9)
Monocytes: 5 %
Neutrophils Absolute: 4.5 10*3/uL (ref 1.4–7.0)
Neutrophils: 62 %
Platelets: 218 10*3/uL (ref 150–450)
RBC: 4.46 x10E6/uL (ref 3.77–5.28)
RDW: 13 % (ref 11.7–15.4)
WBC: 7.2 10*3/uL (ref 3.4–10.8)

## 2021-04-26 LAB — BAYER DCA HB A1C WAIVED: HB A1C (BAYER DCA - WAIVED): 6.7 % (ref <7.0)

## 2021-04-26 LAB — CMP14+EGFR
ALT: 11 IU/L (ref 0–32)
AST: 12 IU/L (ref 0–40)
Albumin/Globulin Ratio: 2 (ref 1.2–2.2)
Albumin: 4.5 g/dL (ref 3.8–4.8)
Alkaline Phosphatase: 71 IU/L (ref 44–121)
BUN/Creatinine Ratio: 20 (ref 12–28)
BUN: 14 mg/dL (ref 8–27)
Bilirubin Total: 0.4 mg/dL (ref 0.0–1.2)
CO2: 25 mmol/L (ref 20–29)
Calcium: 9.5 mg/dL (ref 8.7–10.3)
Chloride: 101 mmol/L (ref 96–106)
Creatinine, Ser: 0.69 mg/dL (ref 0.57–1.00)
Globulin, Total: 2.3 g/dL (ref 1.5–4.5)
Glucose: 144 mg/dL — ABNORMAL HIGH (ref 65–99)
Potassium: 4.5 mmol/L (ref 3.5–5.2)
Sodium: 141 mmol/L (ref 134–144)
Total Protein: 6.8 g/dL (ref 6.0–8.5)
eGFR: 93 mL/min/{1.73_m2} (ref >59)

## 2021-04-26 NOTE — Patient Instructions (Signed)
Health Maintenance After Age 70 After age 70, you are at a higher risk for certain long-term diseases and infections as well as injuries from falls. Falls are a major cause of broken bones and head injuries in people who are older than age 70. Getting regular preventive care can help to keep you healthy and well. Preventive care includes getting regular testing and making lifestyle changes as recommended by your health care provider. Talk with your health care provider about:  Which screenings and tests you should have. A screening is a test that checks for a disease when you have no symptoms.  A diet and exercise plan that is right for you. What should I know about screenings and tests to prevent falls? Screening and testing are the best ways to find a health problem early. Early diagnosis and treatment give you the best chance of managing medical conditions that are common after age 70. Certain conditions and lifestyle choices may make you more likely to have a fall. Your health care provider may recommend:  Regular vision checks. Poor vision and conditions such as cataracts can make you more likely to have a fall. If you wear glasses, make sure to get your prescription updated if your vision changes.  Medicine review. Work with your health care provider to regularly review all of the medicines you are taking, including over-the-counter medicines. Ask your health care provider about any side effects that may make you more likely to have a fall. Tell your health care provider if any medicines that you take make you feel dizzy or sleepy.  Osteoporosis screening. Osteoporosis is a condition that causes the bones to get weaker. This can make the bones weak and cause them to break more easily.  Blood pressure screening. Blood pressure changes and medicines to control blood pressure can make you feel dizzy.  Strength and balance checks. Your health care provider may recommend certain tests to check your  strength and balance while standing, walking, or changing positions.  Foot health exam. Foot pain and numbness, as well as not wearing proper footwear, can make you more likely to have a fall.  Depression screening. You may be more likely to have a fall if you have a fear of falling, feel emotionally low, or feel unable to do activities that you used to do.  Alcohol use screening. Using too much alcohol can affect your balance and may make you more likely to have a fall. What actions can I take to lower my risk of falls? General instructions  Talk with your health care provider about your risks for falling. Tell your health care provider if: ? You fall. Be sure to tell your health care provider about all falls, even ones that seem minor. ? You feel dizzy, sleepy, or off-balance.  Take over-the-counter and prescription medicines only as told by your health care provider. These include any supplements.  Eat a healthy diet and maintain a healthy weight. A healthy diet includes low-fat dairy products, low-fat (lean) meats, and fiber from whole grains, beans, and lots of fruits and vegetables. Home safety  Remove any tripping hazards, such as rugs, cords, and clutter.  Install safety equipment such as grab bars in bathrooms and safety rails on stairs.  Keep rooms and walkways well-lit. Activity  Follow a regular exercise program to stay fit. This will help you maintain your balance. Ask your health care provider what types of exercise are appropriate for you.  If you need a cane or walker,   use it as recommended by your health care provider.  Wear supportive shoes that have nonskid soles.   Lifestyle  Do not drink alcohol if your health care provider tells you not to drink.  If you drink alcohol, limit how much you have: ? 0-1 drink a day for women. ? 0-2 drinks a day for men.  Be aware of how much alcohol is in your drink. In the [place] one drink equals one typical bottle of beer (12  oz), one-half glass of wine (5 oz), or one shot of hard liquor (1 oz).  Do not use any products that contain nicotine or tobacco, such as cigarettes and e-cigarettes. If you need help quitting, ask your health care provider. Summary  Having a healthy lifestyle and getting preventive care can help to protect your health and wellness after age 70.  Screening and testing are the best way to find a health problem early and help you avoid having a fall. Early diagnosis and treatment give you the best chance for managing medical conditions that are more common for people who are older than age 70.  Falls are a major cause of broken bones and head injuries in people who are older than age 70. Take precautions to prevent a fall at home.  Work with your health care provider to learn what changes you can make to improve your health and wellness and to prevent falls. This information is not intended to replace advice given to you by your health care provider. Make sure you discuss any questions you have with your health care provider. Document Revised: 03/25/2019 Document Reviewed: 10/15/2017 Elsevier Patient Education  2021 Elsevier Inc.  

## 2021-04-26 NOTE — Progress Notes (Signed)
Subjective:    Patient ID: Heidi Graham, female    DOB: 1951/06/19, 70 y.o.   MRN: [id number]  Chief Complaint  Patient presents with  . Diabetes    Lossing too much weight   . Hypertension    Pt presents to the office today for chronic follow up.  PT had CABG in 2011 and 2017 is followed by Cardiologists annually. She reports she has lost 40 lbs since starting Ozmepic. Her family is worried. PT states she feels great and is exercising.  Diabetes She presents for her follow-up diabetic visit. She has type 2 diabetes mellitus. Her disease course has been stable. There are no hypoglycemic associated symptoms. Pertinent negatives for diabetes include no blurred vision and no foot paresthesias. Symptoms are stable. Diabetic complications include heart disease. Risk factors for coronary artery disease include dyslipidemia, diabetes mellitus, hypertension and sedentary lifestyle. She is following a generally healthy diet. Her overall blood glucose range is 110-130 mg/dl. An ACE inhibitor/angiotensin II receptor blocker is being taken.  Hyperlipidemia This is a chronic problem. The current episode started more than 1 year ago. The problem is controlled. Pertinent negatives include no shortness of breath. Current antihyperlipidemic treatment includes statins. The current treatment provides moderate improvement of lipids. Risk factors for coronary artery disease include dyslipidemia, diabetes mellitus, hypertension, a sedentary lifestyle and post-menopausal.  Nicotine Dependence Presents for follow-up visit. Her urge triggers include company of smokers. The symptoms have been stable. She smokes < 1/2 a pack of cigarettes per day.  Hypertension This is a chronic problem. The current episode started more than 1 year ago. The problem has been resolved since onset. The problem is controlled. Pertinent negatives include no blurred vision, malaise/fatigue, peripheral edema or shortness of breath. Risk factors  for coronary artery disease include dyslipidemia, diabetes mellitus and sedentary lifestyle. The current treatment provides moderate improvement.      Review of Systems  Constitutional: Negative for malaise/fatigue.  Eyes: Negative for blurred vision.  Respiratory: Negative for shortness of breath.   All other systems reviewed and are negative.      Objective:   Physical Exam Vitals reviewed.  Constitutional:      General: She is not in acute distress.    Appearance: She is well-developed.  HENT:     Head: Normocephalic and atraumatic.     Right Ear: Tympanic membrane normal.     Left Ear: Tympanic membrane normal.  Eyes:     Pupils: Pupils are equal, round, and reactive to light.  Neck:     Thyroid: No thyromegaly.  Cardiovascular:     Rate and Rhythm: Normal rate and regular rhythm.     Heart sounds: Normal heart sounds. No murmur heard.   Pulmonary:     Effort: Pulmonary effort is normal. No respiratory distress.     Breath sounds: Normal breath sounds. No wheezing.  Abdominal:     General: Bowel sounds are normal. There is no distension.     Palpations: Abdomen is soft.     Tenderness: There is no abdominal tenderness.  Musculoskeletal:        General: No tenderness. Normal range of motion.     Cervical back: Normal range of motion and neck supple.  Skin:    General: Skin is warm and dry.  Neurological:     Mental Status: She is alert and oriented to person, place, and time.     Cranial Nerves: No cranial nerve deficit.     Deep Tendon  Reflexes: Reflexes are normal and symmetric.  Psychiatric:        Behavior: Behavior normal.        Thought Content: Thought content normal.        Judgment: Judgment normal.       BP 131/69   Pulse 73   Temp (!) 97 F (36.1 C) (Temporal)   Ht 5' 3" (1.6 m)   Wt 142 lb (64.4 kg)   BMI 25.15 kg/m      Assessment & Plan:  Heidi Graham comes in today with chief complaint of Diabetes (Lossing too much weight ) and  Hypertension   Diagnosis and orders addressed:  1. Hypertension associated with diabetes (HCC) - CMP14+EGFR - CBC with Differential/Platelet  2. Atherosclerosis of coronary artery bypass graft with angina pectoris, unspecified whether native or transplanted heart (HCC) - CMP14+EGFR - CBC with Differential/Platelet  3. Hyperlipidemia associated with type 2 diabetes mellitus (HCC) - CMP14+EGFR - CBC with Differential/Platelet  4. Diabetes mellitus type 2 in obese (HCC) - Bayer DCA Hb A1c Waived - CMP14+EGFR - CBC with Differential/Platelet  5. Hyperlipidemia, unspecified hyperlipidemia type - CMP14+EGFR - CBC with Differential/Platelet  6. Hx of CABG - CMP14+EGFR - CBC with Differential/Platelet  7. Current smoker - CMP14+EGFR - CBC with Differential/Platelet   Labs pending Health Maintenance reviewed Diet and exercise encouraged  Follow up plan: 4 months    Heidi Hawks, FNP  

## 2021-04-26 NOTE — Progress Notes (Signed)
6 

## 2021-04-30 ENCOUNTER — Encounter: Payer: Self-pay | Admitting: Family Medicine

## 2021-05-02 ENCOUNTER — Other Ambulatory Visit: Payer: Self-pay | Admitting: Family

## 2021-05-02 DIAGNOSIS — E1165 Type 2 diabetes mellitus with hyperglycemia: Secondary | ICD-10-CM

## 2021-07-04 LAB — HM DIABETES EYE EXAM

## 2021-08-03 ENCOUNTER — Other Ambulatory Visit: Payer: Self-pay | Admitting: Family

## 2021-08-03 DIAGNOSIS — E1165 Type 2 diabetes mellitus with hyperglycemia: Secondary | ICD-10-CM

## 2021-08-03 DIAGNOSIS — I152 Hypertension secondary to endocrine disorders: Secondary | ICD-10-CM

## 2021-08-03 DIAGNOSIS — E785 Hyperlipidemia, unspecified: Secondary | ICD-10-CM

## 2021-08-03 DIAGNOSIS — E1159 Type 2 diabetes mellitus with other circulatory complications: Secondary | ICD-10-CM

## 2021-08-03 DIAGNOSIS — E1169 Type 2 diabetes mellitus with other specified complication: Secondary | ICD-10-CM

## 2021-08-28 ENCOUNTER — Other Ambulatory Visit: Payer: Self-pay

## 2021-08-28 ENCOUNTER — Ambulatory Visit (INDEPENDENT_AMBULATORY_CARE_PROVIDER_SITE_OTHER): Payer: Medicare Other | Admitting: Family

## 2021-08-28 ENCOUNTER — Encounter: Payer: Self-pay | Admitting: Family

## 2021-08-28 VITALS — BP 145/64 | HR 69 | Temp 97.2°F | Ht 63.0 in | Wt 140.8 lb

## 2021-08-28 DIAGNOSIS — I152 Hypertension secondary to endocrine disorders: Secondary | ICD-10-CM | POA: Diagnosis not present

## 2021-08-28 DIAGNOSIS — F17200 Nicotine dependence, unspecified, uncomplicated: Secondary | ICD-10-CM | POA: Diagnosis not present

## 2021-08-28 DIAGNOSIS — J42 Unspecified chronic bronchitis: Secondary | ICD-10-CM

## 2021-08-28 DIAGNOSIS — E1169 Type 2 diabetes mellitus with other specified complication: Secondary | ICD-10-CM

## 2021-08-28 DIAGNOSIS — E1159 Type 2 diabetes mellitus with other circulatory complications: Secondary | ICD-10-CM

## 2021-08-28 DIAGNOSIS — I25709 Atherosclerosis of coronary artery bypass graft(s), unspecified, with unspecified angina pectoris: Secondary | ICD-10-CM | POA: Diagnosis not present

## 2021-08-28 DIAGNOSIS — Z951 Presence of aortocoronary bypass graft: Secondary | ICD-10-CM

## 2021-08-28 DIAGNOSIS — H6121 Impacted cerumen, right ear: Secondary | ICD-10-CM

## 2021-08-28 DIAGNOSIS — E669 Obesity, unspecified: Secondary | ICD-10-CM

## 2021-08-28 DIAGNOSIS — E785 Hyperlipidemia, unspecified: Secondary | ICD-10-CM

## 2021-08-28 LAB — CMP14+EGFR
ALT: 12 IU/L (ref 0–32)
AST: 15 IU/L (ref 0–40)
Albumin/Globulin Ratio: 2.1 (ref 1.2–2.2)
Albumin: 4.6 g/dL (ref 3.8–4.8)
Alkaline Phosphatase: 74 IU/L (ref 44–121)
BUN/Creatinine Ratio: 16 (ref 12–28)
BUN: 11 mg/dL (ref 8–27)
Bilirubin Total: 0.4 mg/dL (ref 0.0–1.2)
CO2: 26 mmol/L (ref 20–29)
Calcium: 9.8 mg/dL (ref 8.7–10.3)
Chloride: 104 mmol/L (ref 96–106)
Creatinine, Ser: 0.68 mg/dL (ref 0.57–1.00)
Globulin, Total: 2.2 g/dL (ref 1.5–4.5)
Glucose: 121 mg/dL — ABNORMAL HIGH (ref 65–99)
Potassium: 4.5 mmol/L (ref 3.5–5.2)
Sodium: 141 mmol/L (ref 134–144)
Total Protein: 6.8 g/dL (ref 6.0–8.5)
eGFR: 94 mL/min/{1.73_m2} (ref >59)

## 2021-08-28 LAB — CBC WITH DIFFERENTIAL/PLATELET
Basophils Absolute: 0 10*3/uL (ref 0.0–0.2)
Basos: 1 %
EOS (ABSOLUTE): 0.1 10*3/uL (ref 0.0–0.4)
Eos: 2 %
Hematocrit: 41.3 % (ref 34.0–46.6)
Hemoglobin: 13.5 g/dL (ref 11.1–15.9)
Immature Grans (Abs): 0 10*3/uL (ref 0.0–0.1)
Immature Granulocytes: 0 %
Lymphocytes Absolute: 2.1 10*3/uL (ref 0.7–3.1)
Lymphs: 40 %
MCH: 30.3 pg (ref 26.6–33.0)
MCHC: 32.7 g/dL (ref 31.5–35.7)
MCV: 93 fL (ref 79–97)
Monocytes Absolute: 0.3 10*3/uL (ref 0.1–0.9)
Monocytes: 6 %
Neutrophils Absolute: 2.7 10*3/uL (ref 1.4–7.0)
Neutrophils: 51 %
Platelets: 212 10*3/uL (ref 150–450)
RBC: 4.45 x10E6/uL (ref 3.77–5.28)
RDW: 12.2 % (ref 11.7–15.4)
WBC: 5.3 10*3/uL (ref 3.4–10.8)

## 2021-08-28 LAB — BAYER DCA HB A1C WAIVED: HB A1C (BAYER DCA - WAIVED): 6.4 % — ABNORMAL HIGH (ref 4.8–5.6)

## 2021-08-28 MED ORDER — METOPROLOL SUCCINATE ER 25 MG PO TB24: ORAL_TABLET | ORAL | 2 refills | Status: DC

## 2021-08-28 NOTE — Patient Instructions (Signed)
Health Maintenance After Age 70 After age 70, you are at a higher risk for certain long-term diseases and infections as well as injuries from falls. Falls are a major cause of broken bones and head injuries in people who are older than age 70. Getting regular preventive care can help to keep you healthy and well. Preventive care includes getting regular testing and making lifestyle changes as recommended by your health care provider. Talk with your health care provider about: Which screenings and tests you should have. A screening is a test that checks for a disease when you have no symptoms. A diet and exercise plan that is right for you. What should I know about screenings and tests to prevent falls? Screening and testing are the best ways to find a health problem early. Early diagnosis and treatment give you the best chance of managing medical conditions that are common after age 70. Certain conditions and lifestyle choices may make you more likely to have a fall. Your health care provider may recommend: Regular vision checks. Poor vision and conditions such as cataracts can make you more likely to have a fall. If you wear glasses, make sure to get your prescription updated if your vision changes. Medicine review. Work with your health care provider to regularly review all of the medicines you are taking, including over-the-counter medicines. Ask your health care provider about any side effects that may make you more likely to have a fall. Tell your health care provider if any medicines that you take make you feel dizzy or sleepy. Osteoporosis screening. Osteoporosis is a condition that causes the bones to get weaker. This can make the bones weak and cause them to break more easily. Blood pressure screening. Blood pressure changes and medicines to control blood pressure can make you feel dizzy. Strength and balance checks. Your health care provider may recommend certain tests to check your strength and  balance while standing, walking, or changing positions. Foot health exam. Foot pain and numbness, as well as not wearing proper footwear, can make you more likely to have a fall. Depression screening. You may be more likely to have a fall if you have a fear of falling, feel emotionally low, or feel unable to do activities that you used to do. Alcohol use screening. Using too much alcohol can affect your balance and may make you more likely to have a fall. What actions can I take to lower my risk of falls? General instructions Talk with your health care provider about your risks for falling. Tell your health care provider if: You fall. Be sure to tell your health care provider about all falls, even ones that seem minor. You feel dizzy, sleepy, or off-balance. Take over-the-counter and prescription medicines only as told by your health care provider. These include any supplements. Eat a healthy diet and maintain a healthy weight. A healthy diet includes low-fat dairy products, low-fat (lean) meats, and fiber from whole grains, beans, and lots of fruits and vegetables. Home safety Remove any tripping hazards, such as rugs, cords, and clutter. Install safety equipment such as grab bars in bathrooms and safety rails on stairs. Keep rooms and walkways well-lit. Activity  Follow a regular exercise program to stay fit. This will help you maintain your balance. Ask your health care provider what types of exercise are appropriate for you. If you need a cane or walker, use it as recommended by your health care provider. Wear supportive shoes that have nonskid soles. Lifestyle Do not   drink alcohol if your health care provider tells you not to drink. If you drink alcohol, limit how much you have: 0-1 drink a day for women. 0-2 drinks a day for men. Be aware of how much alcohol is in your drink. In the [place] one drink equals one typical bottle of beer (12 oz), one-half glass of wine (5 oz), or one shot of  hard liquor (1 oz). Do not use any products that contain nicotine or tobacco, such as cigarettes and e-cigarettes. If you need help quitting, ask your health care provider. Summary Having a healthy lifestyle and getting preventive care can help to protect your health and wellness after age 70. Screening and testing are the best way to find a health problem early and help you avoid having a fall. Early diagnosis and treatment give you the best chance for managing medical conditions that are more common for people who are older than age 70. Falls are a major cause of broken bones and head injuries in people who are older than age 70. Take precautions to prevent a fall at home. Work with your health care provider to learn what changes you can make to improve your health and wellness and to prevent falls. This information is not intended to replace advice given to you by your health care provider. Make sure you discuss any questions you have with your health care provider. Document Revised: 02/09/2021 Document Reviewed: 11/17/2020 Elsevier Patient Education  2022 Elsevier Inc.  

## 2021-08-28 NOTE — Progress Notes (Signed)
Subjective:    Patient ID: Heidi Graham, female    DOB: March 26, 1951, 70 y.o.   MRN: [id number]  Chief Complaint  Patient presents with   Medical Management of Chronic Issues    Patient declined all HM at this time , beside her mammo which she has scheduled in October, and flu shot. She is excited do see    Pt presents to the office today for chronic follow up.  PT had CABG in 2011 and 2017 is followed by Cardiologists annually. She reports she has lost 41 lbs since starting Ozmepic.  PT states she feels great and is exercising.  Hypertension This is a chronic problem. The current episode started more than 1 year ago. The problem has been waxing and waning since onset. The problem is uncontrolled. Associated symptoms include malaise/fatigue. Pertinent negatives include no blurred vision, peripheral edema or shortness of breath. Risk factors for coronary artery disease include dyslipidemia and diabetes mellitus. The current treatment provides moderate improvement.  Diabetes She presents for her follow-up diabetic visit. She has type 2 diabetes mellitus. Pertinent negatives for diabetes include no blurred vision and no foot paresthesias. Symptoms are stable. Diabetic complications include heart disease. Risk factors for coronary artery disease include dyslipidemia, diabetes mellitus, hypertension, sedentary lifestyle and post-menopausal. She is following a diabetic diet. Her overall blood glucose range is 110-130 mg/dl. Eye exam is current.  Hyperlipidemia This is a chronic problem. The current episode started more than 1 year ago. The problem is controlled. Pertinent negatives include no shortness of breath. Current antihyperlipidemic treatment includes statins. The current treatment provides moderate improvement of lipids. Risk factors for coronary artery disease include dyslipidemia, diabetes mellitus, hypertension, a sedentary lifestyle and post-menopausal.  Nicotine Dependence Presents for  follow-up visit. Her urge triggers include company of smokers. The symptoms have been stable. She smokes < 1/2 a pack of cigarettes per day.  Ear Fullness  There is pain in the right ear. This is a new problem. The current episode started 1 to 4 weeks ago.  COPD Continues to smoke, but has decreased to 6 cigarettes a day and trying to quit. States her breathing is "fine".    Review of Systems  Constitutional:  Positive for malaise/fatigue.  Eyes:  Negative for blurred vision.  Respiratory:  Negative for shortness of breath.   All other systems reviewed and are negative.     Objective:   Physical Exam Vitals reviewed.  Constitutional:      General: She is not in acute distress.    Appearance: She is well-developed.  HENT:     Head: Normocephalic and atraumatic.     Right Ear: There is impacted cerumen.     Left Ear: Tympanic membrane normal.  Eyes:     Pupils: Pupils are equal, round, and reactive to light.  Neck:     Thyroid: No thyromegaly.  Cardiovascular:     Rate and Rhythm: Normal rate and regular rhythm.     Heart sounds: Normal heart sounds. No murmur heard. Pulmonary:     Effort: Pulmonary effort is normal. No respiratory distress.     Breath sounds: Normal breath sounds. No wheezing.  Abdominal:     General: Bowel sounds are normal. There is no distension.     Palpations: Abdomen is soft.     Tenderness: There is no abdominal tenderness.  Musculoskeletal:        General: No tenderness. Normal range of motion.     Cervical back: Normal range  of motion and neck supple.  Skin:    General: Skin is warm and dry.  Neurological:     Mental Status: She is alert and oriented to person, place, and time.     Cranial Nerves: No cranial nerve deficit.     Deep Tendon Reflexes: Reflexes are normal and symmetric.  Psychiatric:        Behavior: Behavior normal.        Thought Content: Thought content normal.        Judgment: Judgment normal.   Warm water and peroxide used  and TM WNL  BP (!) 145/64   Pulse 69   Temp (!) 97.2 F (36.2 C) (Temporal)   Ht $R'5\' 3"'ND$  (1.6 m)   Wt 140 lb 12.8 oz (63.9 kg)   BMI 24.94 kg/m      Assessment & Plan:  Heidi Graham comes in today with chief complaint of Medical Management of Chronic Issues (Patient declined all HM at this time , beside her mammo which she has scheduled in October, and flu shot. She is excited do see )   Diagnosis and orders addressed:  1. Hypertension associated with diabetes ([place]) - metoprolol succinate (TOPROL-XL) 25 MG 24 hr tablet; TAKE 1 TABLET BY MOUTH EVERY DAY  Dispense: 90 tablet; Refill: 2 - CMP14+EGFR - CBC with Differential/Platelet  2. Chronic bronchitis, unspecified chronic bronchitis type ([place])  - CMP14+EGFR - CBC with Differential/Platelet  3. Diabetes mellitus type 2 in obese (HCC) - Bayer DCA Hb A1c Waived - CMP14+EGFR - CBC with Differential/Platelet  4. Hyperlipidemia associated with type 2 diabetes mellitus (HCC) - CMP14+EGFR - CBC with Differential/Platelet  5. Current smoker - CMP14+EGFR - CBC with Differential/Platelet  6. Hyperlipidemia, unspecified hyperlipidemia type - CMP14+EGFR - CBC with Differential/Platelet  7. Hx of CABG - CMP14+EGFR - CBC with Differential/Platelet  8. Impacted cerumen of right ear - CMP14+EGFR - CBC with Differential/Platelet   Labs pending Health Maintenance reviewed Diet and exercise encouraged  Follow up plan: 6 months    Evelina Dun, FNP

## 2021-09-02 ENCOUNTER — Other Ambulatory Visit: Payer: Self-pay | Admitting: Family

## 2021-09-02 DIAGNOSIS — E1169 Type 2 diabetes mellitus with other specified complication: Secondary | ICD-10-CM

## 2021-09-02 DIAGNOSIS — E669 Obesity, unspecified: Secondary | ICD-10-CM

## 2021-10-10 ENCOUNTER — Ambulatory Visit
Admission: RE | Admit: 2021-10-10 | Discharge: 2021-10-10 | Disposition: A | Discharge status: Home / Self Care | Payer: Medicare Other | Source: Ambulatory Visit | Attending: Family | Admitting: Family

## 2021-10-10 ENCOUNTER — Other Ambulatory Visit: Payer: Self-pay

## 2021-10-10 DIAGNOSIS — Z1231 Encounter for screening mammogram for malignant neoplasm of breast: Secondary | ICD-10-CM

## 2021-10-13 ENCOUNTER — Other Ambulatory Visit: Payer: Self-pay | Admitting: Family

## 2021-10-13 DIAGNOSIS — E1165 Type 2 diabetes mellitus with hyperglycemia: Secondary | ICD-10-CM

## 2021-11-04 ENCOUNTER — Other Ambulatory Visit: Payer: Self-pay | Admitting: Family

## 2021-11-04 DIAGNOSIS — E1159 Type 2 diabetes mellitus with other circulatory complications: Secondary | ICD-10-CM

## 2021-11-04 DIAGNOSIS — E1169 Type 2 diabetes mellitus with other specified complication: Secondary | ICD-10-CM

## 2021-11-04 DIAGNOSIS — I152 Hypertension secondary to endocrine disorders: Secondary | ICD-10-CM

## 2021-11-04 DIAGNOSIS — E1165 Type 2 diabetes mellitus with hyperglycemia: Secondary | ICD-10-CM

## 2021-11-23 DIAGNOSIS — W268XXA Contact with other sharp object(s), not elsewhere classified, initial encounter: Secondary | ICD-10-CM | POA: Diagnosis not present

## 2021-11-23 DIAGNOSIS — S61412A Laceration without foreign body of left hand, initial encounter: Secondary | ICD-10-CM | POA: Diagnosis not present

## 2021-11-23 DIAGNOSIS — I2510 Atherosclerotic heart disease of native coronary artery without angina pectoris: Secondary | ICD-10-CM | POA: Diagnosis not present

## 2021-11-23 DIAGNOSIS — I119 Hypertensive heart disease without heart failure: Secondary | ICD-10-CM | POA: Diagnosis not present

## 2021-11-23 DIAGNOSIS — E119 Type 2 diabetes mellitus without complications: Secondary | ICD-10-CM | POA: Diagnosis not present

## 2021-11-23 DIAGNOSIS — M79642 Pain in left hand: Secondary | ICD-10-CM | POA: Diagnosis not present

## 2021-11-23 DIAGNOSIS — F17210 Nicotine dependence, cigarettes, uncomplicated: Secondary | ICD-10-CM | POA: Diagnosis not present

## 2022-02-01 ENCOUNTER — Ambulatory Visit (INDEPENDENT_AMBULATORY_CARE_PROVIDER_SITE_OTHER): Payer: Medicare Other

## 2022-02-01 VITALS — Wt 140.0 lb

## 2022-02-01 DIAGNOSIS — Z0000 Encounter for general adult medical examination without abnormal findings: Secondary | ICD-10-CM

## 2022-02-01 NOTE — Patient Instructions (Signed)
Heidi Graham , Thank you for taking time to come for your Medicare Wellness Visit. I appreciate your ongoing commitment to your health goals. Please review the following plan we discussed and let me know if I can assist you in the future.   Screening recommendations/referrals: Colonoscopy: Done 06/29/2012 - Repeat in 10 years Mammogram: Done 10/10/2021 - Repeat annually Bone Density: Done 11/15/2020 - Repeat every 2 years Recommended yearly ophthalmology/optometry visit for glaucoma screening and checkup Recommended yearly dental visit for hygiene and checkup  Vaccinations: Influenza vaccine: Done 10/05/2021 - Repeat annually Pneumococcal vaccine: Done 08/18/2018 - ask about Prevnar Tdap vaccine: Done 11/20/2020 - Repeat in 10 years Shingles vaccine: Zostavax done 2014 - due for Shingrix2   Covid-19: Declined  Advanced directives: Advance directive discussed with you today. Even though you declined this today, please call our office should you change your mind, and we can give you the proper paperwork for you to fill out.   Conditions/risks identified: Aim for 30 minutes of exercise or brisk walking each day, drink 6-8 glasses of water and eat lots of fruits and vegetables.   Next appointment: Follow up in one year for your annual wellness visit    Preventive Care 65 Years and Older, Female Preventive care refers to lifestyle choices and visits with your health care provider that can promote health and wellness. What does preventive care include? A yearly physical exam. This is also called an annual well check. Dental exams once or twice a year. Routine eye exams. Ask your health care provider how often you should have your eyes checked. Personal lifestyle choices, including: Daily care of your teeth and gums. Regular physical activity. Eating a healthy diet. Avoiding tobacco and drug use. Limiting alcohol use. Practicing safe sex. Taking low-dose aspirin every day. Taking vitamin and  mineral supplements as recommended by your health care provider. What happens during an annual well check? The services and screenings done by your health care provider during your annual well check will depend on your age, overall health, lifestyle risk factors, and family history of disease. Counseling  Your health care provider may ask you questions about your: Alcohol use. Tobacco use. Drug use. Emotional well-being. Home and relationship well-being. Sexual activity. Eating habits. History of falls. Memory and ability to understand (cognition). Work and work Astronomer. Reproductive health. Screening  You may have the following tests or measurements: Height, weight, and BMI. Blood pressure. Lipid and cholesterol levels. These may be checked every 5 years, or more frequently if you are over 40 years old. Skin check. Lung cancer screening. You may have this screening every year starting at age 71 if you have a 30-pack-year history of smoking and currently smoke or have quit within the past 15 years. Fecal occult blood test (FOBT) of the stool. You may have this test every year starting at age 71. Flexible sigmoidoscopy or colonoscopy. You may have a sigmoidoscopy every 5 years or a colonoscopy every 10 years starting at age 71. Hepatitis C blood test. Hepatitis B blood test. Sexually transmitted disease (STD) testing. Diabetes screening. This is done by checking your blood sugar (glucose) after you have not eaten for a while (fasting). You may have this done every 1-3 years. Bone density scan. This is done to screen for osteoporosis. You may have this done starting at age 20. Mammogram. This may be done every 1-2 years. Talk to your health care provider about how often you should have regular mammograms. Talk with your health care  provider about your test results, treatment options, and if necessary, the need for more tests. Vaccines  Your health care provider may recommend  certain vaccines, such as: Influenza vaccine. This is recommended every year. Tetanus, diphtheria, and acellular pertussis (Tdap, Td) vaccine. You may need a Td booster every 10 years. Zoster vaccine. You may need this after age 71. Pneumococcal 13-valent conjugate (PCV13) vaccine. One dose is recommended after age 71. Pneumococcal polysaccharide (PPSV23) vaccine. One dose is recommended after age 71. Talk to your health care provider about which screenings and vaccines you need and how often you need them. This information is not intended to replace advice given to you by your health care provider. Make sure you discuss any questions you have with your health care provider. Document Released: 12/29/2015 Document Revised: 08/21/2016 Document Reviewed: 10/03/2015 Elsevier Interactive Patient Education  2017 [employer].  Fall Prevention in the Home Falls can cause injuries. They can happen to people of all ages. There are many things you can do to make your home safe and to help prevent falls. What can I do on the outside of my home? Regularly fix the edges of walkways and driveways and fix any cracks. Remove anything that might make you trip as you walk through a door, such as a raised step or threshold. Trim any bushes or trees on the path to your home. Use bright outdoor lighting. Clear any walking paths of anything that might make someone trip, such as rocks or tools. Regularly check to see if handrails are loose or broken. Make sure that both sides of any steps have handrails. Any raised decks and porches should have guardrails on the edges. Have any leaves, snow, or ice cleared regularly. Use sand or salt on walking paths during winter. Clean up any spills in your garage right away. This includes oil or grease spills. What can I do in the bathroom? Use night lights. Install grab bars by the toilet and in the tub and shower. Do not use towel bars as grab bars. Use non-skid mats or  decals in the tub or shower. If you need to sit down in the shower, use a plastic, non-slip stool. Keep the floor dry. Clean up any water that spills on the floor as soon as it happens. Remove soap buildup in the tub or shower regularly. Attach bath mats securely with double-sided non-slip rug tape. Do not have throw rugs and other things on the floor that can make you trip. What can I do in the bedroom? Use night lights. Make sure that you have a light by your bed that is easy to reach. Do not use any sheets or blankets that are too big for your bed. They should not hang down onto the floor. Have a firm chair that has side arms. You can use this for support while you get dressed. Do not have throw rugs and other things on the floor that can make you trip. What can I do in the kitchen? Clean up any spills right away. Avoid walking on wet floors. Keep items that you use a lot in easy-to-reach places. If you need to reach something above you, use a strong step stool that has a grab bar. Keep electrical cords out of the way. Do not use floor polish or wax that makes floors slippery. If you must use wax, use non-skid floor wax. Do not have throw rugs and other things on the floor that can make you trip. What can I  do with my stairs? Do not leave any items on the stairs. Make sure that there are handrails on both sides of the stairs and use them. Fix handrails that are broken or loose. Make sure that handrails are as long as the stairways. Check any carpeting to make sure that it is firmly attached to the stairs. Fix any carpet that is loose or worn. Avoid having throw rugs at the top or bottom of the stairs. If you do have throw rugs, attach them to the floor with carpet tape. Make sure that you have a light switch at the top of the stairs and the bottom of the stairs. If you do not have them, ask someone to add them for you. What else can I do to help prevent falls? Wear shoes that: Do not  have high heels. Have rubber bottoms. Are comfortable and fit you well. Are closed at the toe. Do not wear sandals. If you use a stepladder: Make sure that it is fully opened. Do not climb a closed stepladder. Make sure that both sides of the stepladder are locked into place. Ask someone to hold it for you, if possible. Clearly mark and make sure that you can see: Any grab bars or handrails. First and last steps. Where the edge of each step is. Use tools that help you move around (mobility aids) if they are needed. These include: Canes. Walkers. Scooters. Crutches. Turn on the lights when you go into a dark area. Replace any light bulbs as soon as they burn out. Set up your furniture so you have a clear path. Avoid moving your furniture around. If any of your floors are uneven, fix them. If there are any pets around you, be aware of where they are. Review your medicines with your doctor. Some medicines can make you feel dizzy. This can increase your chance of falling. Ask your doctor what other things that you can do to help prevent falls. This information is not intended to replace advice given to you by your health care provider. Make sure you discuss any questions you have with your health care provider. Document Released: 09/28/2009 Document Revised: 05/09/2016 Document Reviewed: 01/06/2015 Elsevier Interactive Patient Education  2017 [employer].

## 2022-02-01 NOTE — Progress Notes (Signed)
Subjective:   Heidi Graham is a 71 y.o. female who presents for Medicare Annual (Subsequent) preventive examination.  Virtual Visit via Telephone Note  I connected with  Heidi Graham on 02/01/22 at 11:15 AM EST by telephone and verified that I am speaking with the correct person using two identifiers.  Location: Patient: Home Provider: [facility] Persons participating in the virtual visit: patient/Nurse Health Advisor   I discussed the limitations, risks, security and privacy concerns of performing an evaluation and management service by telephone and the availability of in person appointments. The patient expressed understanding and agreed to proceed.  Interactive audio and video telecommunications were attempted between this nurse and patient, however failed, due to patient having technical difficulties OR patient did not have access to video capability.  We continued and completed visit with audio only.  Some vital signs may be absent or patient reported.   Dominie Benedick E Candence Sease, LPN   Review of Systems     Cardiac Risk Factors include: advanced age (>8mn, >>72women);dyslipidemia;hypertension;smoking/ tobacco exposure;Other (see comment);diabetes mellitus, Risk factor comments: hx of CABG, atherosclerosis, chronic bronchitis     Objective:    Today's Vitals   02/01/22 1115  Weight: 140 lb (63.5 kg)   Body mass index is 24.8 kg/m.  Advanced Directives 02/01/2022 01/31/2021 08/18/2018 01/21/2017 12/11/2016  Does Patient Have a Medical Advance Directive? _0   Would patient like information on creating a medical advance directive? No - Patient declined No - Patient declined Yes (MAU/Ambulatory/Procedural Areas - Information given) Yes (MAU/Ambulatory/Procedural Areas - Information given) No - Patient declined    Current Medications (verified) Outpatient Encounter Medications as of 02/01/2022  Medication Sig   aspirin EC 81 MG tablet Take 1 tablet (81 mg total) by mouth daily.  Swallow whole.   Blood Glucose Monitoring Suppl (ONETOUCH VERIO FLEX SYSTEM) w/Device KIT Use to test blood sugar two times daily as directed. DX:E11.9   glucose blood (ONETOUCH VERIO) test strip Use to test blood sugar two times daily as directed. DX:E11.9   ibuprofen (ADVIL) 400 MG tablet Take 400 mg by mouth every 6 (six) hours as needed.   isosorbide mononitrate (IMDUR) 30 MG 24 hr tablet Take 1 tablet (30 mg total) by mouth daily.   JARDIANCE 25 MG TABS tablet TAKE 1 TABLET BY MOUTH DAILY BEFORE BREAKFAST.   metFORMIN (GLUCOPHAGE-XR) 750 MG 24 hr tablet TAKE 2 TABLETS (1,500 MG TOTAL) BY MOUTH DAILY WITH BREAKFAST.   metoprolol succinate (TOPROL-XL) 25 MG 24 hr tablet TAKE 1 TABLET BY MOUTH EVERY DAY   Multiple Vitamin (THERA) TABS Take by mouth.   nitroGLYCERIN (NITROSTAT) 0.4 MG SL tablet Place 0.4 mg under the tongue every 5 (five) minutes as needed for chest pain.   OneTouch Delica Lancets 357SMISC Use to test blood sugar two times daily as directed. DX:E11.9   OZEMPIC, 0.25 OR 0.5 MG/DOSE, 2 MG/1.5ML SOPN Inject 0.5 mg into the muscle once a week.   quinapril (ACCUPRIL) 10 MG tablet TAKE 1 TABLET BY MOUTH EVERYDAY AT BEDTIME   rosuvastatin (CRESTOR) 10 MG tablet TAKE 1 TABLET BY MOUTH EVERYDAY AT BEDTIME   No facility-administered encounter medications on file as of 02/01/2022.    Allergies (verified) Codeine   History: Past Medical History:  Diagnosis Date   Arthritis    Cataract    Diabetes mellitus without complication ([place]    Hyperlipidemia    Hypertension    Myocardial infarction ([facility] 2011   Past  Surgical History:  Procedure Laterality Date   CARDIAC SURGERY     Triple  Bypass   CESAREAN SECTION     two   CORONARY ARTERY BYPASS GRAFT     HERNIA REPAIR     umbilical   Family History  Problem Relation Age of Onset   Diabetes Mother    Hypertension Mother    Diabetes Daughter    Diabetes Son    Diabetes Sister        type 2   Hypertension Sister     Diabetes Brother        type 1   Social History   Socioeconomic History   Marital status: Widowed    Spouse name: Not on file   Number of children: 3   Years of education: Not on file   Highest education level: Not on file  Occupational History   Occupation: retired   Tobacco Use   Smoking status: Former    Packs/day: 0.30    Years: 28.00    Pack years: 8.40    Types: Cigarettes    Quit date: 01/16/2022    Years since quitting: 0.0   Smokeless tobacco: Never   Tobacco comments:    smoked for 20 years - quit for 9 years and restarted about 6 yeasr ago.    Quit again 01/16/2022  Vaping Use   Vaping Use: Never used  Substance and Sexual Activity   Alcohol use: No   Drug use: No   Sexual activity: Never  Other Topics Concern   Not on file  Social History Narrative   Lives with daughter and 2 grandchildren    Son is truck Geophysicist/field seismologist and lives in [place]   Other son in [place], [place]   Social Determinants of Health   Financial Resource Strain: Low Risk    Difficulty of Paying Living Expenses: Not hard at all  Food Insecurity: No Food Insecurity   Worried About Charity fundraiser in the Last Year: Never true   Arboriculturist in the Last Year: Never true  Transportation Needs: No Transportation Needs   Lack of Transportation (Medical): No   Lack of Transportation (Non-Medical): No  Physical Activity: Sufficiently Active   Days of Exercise per Week: 7 days   Minutes of Exercise per Session: 30 min  Stress: No Stress Concern Present   Feeling of Stress : Not at all  Social Connections: Socially Isolated   Frequency of Communication with Friends and Family: More than three times a week   Frequency of Social Gatherings with Friends and Family: Twice a week   Attends Religious Services: Never   Marine scientist or Organizations: No   Attends Archivist Meetings: Never   Marital Status: Widowed    Tobacco Counseling Counseling given: Not  Answered Tobacco comments: smoked for 20 years - quit for 9 years and restarted about 6 yeasr ago. Quit again 01/16/2022   Clinical Intake:  Pre-visit preparation completed: Yes  Pain : No/denies pain     BMI - recorded: 24.8 Nutritional Status: BMI of 19-24  Normal Nutritional Risks: Nausea/ vomitting/ diarrhea (diarrhea x 2 days - may have gastritis) Diabetes: Yes CBG done?: No Did pt. bring in CBG monitor from home?: No  How often do you need to have someone help you when you read instructions, pamphlets, or other written materials from your doctor or pharmacy?: 1 - Never  Diabetic? Nutrition Risk Assessment:  Has the patient had  any N/V/D within the last 2 months?  Yes  Does the patient have any non-healing wounds?  No  Has the patient had any unintentional weight loss or weight gain?   Thinks she has lost weight since staring Ozympic and has lost appetite  Diabetes:  Is the patient diabetic?  Yes  If diabetic, was a CBG obtained today?  No  Did the patient bring in their glucometer from home?  No  How often do you monitor your CBG's? Once daily fasting - 115 this am per patient.   Financial Strains and Diabetes Management:  Are you having any financial strains with the device, your supplies or your medication? No .  Does the patient want to be seen by Chronic Care Management for management of their diabetes?  No  Would the patient like to be referred to a Nutritionist or for Diabetic Management?  No   Diabetic Exams:  Diabetic Eye Exam: Completed 07/04/2021.   Diabetic Foot Exam: Completed 11/14/2020. Pt has been advised about the importance in completing this exam. Pt is scheduled for diabetic foot exam on next visit.    Interpreter Needed?: No  Information entered by :: Kuper Rennels, LPN   Activities of Daily Living In your present state of health, do you have any difficulty performing the following activities: 02/01/2022  Hearing? N  Vision? N  Difficulty  concentrating or making decisions? N  Walking or climbing stairs? N  Dressing or bathing? N  Doing errands, shopping? N  Preparing Food and eating ? N  Using the Toilet? N  In the past six months, have you accidently leaked urine? N  Do you have problems with loss of bowel control? N  Managing your Medications? N  Managing your Finances? N  Housekeeping or managing your Housekeeping? N  Some recent data might be hidden    Patient Care Team: Sharion Balloon, FNP as PCP - General (Nurse Practitioner) Remer Macho, Larene Beach, MD as Referring Physician (Internal Medicine) Celestia Khat, OD (Optometry)  Indicate any recent Medical Services you may have received from other than [facility] providers in the past year (date may be approximate).     Assessment:   This is a routine wellness examination for Heidi Graham.  Hearing/Vision screen Hearing Screening - Comments:: Denies hearing difficulties   Vision Screening - Comments:: Wears rx glasses - up to date with routine eye exams with [facility]  Dietary issues and exercise activities discussed: Current Exercise Habits: Home exercise routine, Type of exercise: walking, Time (Minutes): 30, Frequency (Times/Week): 7, Weekly Exercise (Minutes/Week): 210, Intensity: Mild, Exercise limited by: respiratory conditions(s)   Goals Addressed             This Visit's Progress    Quit Smoking   On track    Continue not smoking Quit as of 01/16/2022       Depression Screen PHQ 2/9 Scores 02/01/2022 04/26/2021 01/31/2021 01/23/2021 12/21/2020 11/14/2020 09/28/2018  PHQ - 2 Score 0 0 0 0 0 0 0  PHQ- 9 Score - - - - - 5 -    Fall Risk Fall Risk  02/01/2022 04/26/2021 01/31/2021 01/23/2021 12/21/2020  Falls in the past year? 0 0 1 1 0  Comment - - - - -  Number falls in past yr: 0 - 0 0 -  Injury with Fall? 0 - 1 1 -  Risk for fall due to : No Fall Risks - History of fall(s) Impaired balance/gait -  Follow up Falls prevention discussed -  Falls evaluation  completed Education provided -    FALL RISK PREVENTION PERTAINING TO THE HOME:  Any stairs in or around the home? Yes  If so, are there any without handrails? No  Home free of loose throw rugs in walkways, pet beds, electrical cords, etc? Yes  Adequate lighting in your home to reduce risk of falls? Yes   ASSISTIVE DEVICES UTILIZED TO PREVENT FALLS:  Life alert? No  Use of a cane, walker or w/c? No  Grab bars in the bathroom? Yes  Shower chair or bench in shower? No  Elevated toilet seat or a handicapped toilet? Yes   TIMED UP AND GO:  Was the test performed? No . Telephonic visit  Cognitive Function: Normal cognitive status assessed by direct observation by this Nurse Health Advisor. No abnormalities found.   MMSE - Mini Mental State Exam 08/18/2018 01/21/2017  Orientation to time 5 5  Orientation to Place 5 4  Registration 3 3  Attention/ Calculation 5 5  Recall 3 2  Language- name 2 objects 2 2  Language- repeat 1 1  Language- follow 3 step command 3 3  Language- read & follow direction 1 1  Write a sentence 1 1  Copy design 1 1  Total score 30 28     6CIT Screen 01/31/2021  What Year? 0 points  What month? 0 points  What time? 0 points  Count back from 20 0 points  Months in reverse 0 points  Repeat phrase 0 points  Total Score 0    Immunizations Immunization History  Administered Date(s) Administered   Influenza, High Dose Seasonal PF 10/08/2017, 11/09/2018, 09/07/2019, 10/05/2021   Influenza, Seasonal, Injecte, Preservative Fre 11/17/2014   Influenza,inj,Quad PF,6+ Mos 11/17/2014, 10/19/2015   Influenza-Unspecified 10/15/2017, 10/05/2020   Pneumococcal Polysaccharide-23 08/18/2018   Tdap 11/20/2020    TDAP status: Up to date  Flu Vaccine status: Up to date  Pneumococcal vaccine status: Due, Education has been provided regarding the importance of this vaccine. Advised may receive this vaccine at local pharmacy or Health Dept. Aware to provide a copy of  the vaccination record if obtained from local pharmacy or Health Dept. Verbalized acceptance and understanding.  Covid-19 vaccine status: Declined, Education has been provided regarding the importance of this vaccine but patient still declined. Advised may receive this vaccine at local pharmacy or Health Dept.or vaccine clinic. Aware to provide a copy of the vaccination record if obtained from local pharmacy or Health Dept. Verbalized acceptance and understanding.  Qualifies for Shingles Vaccine? Yes   Zostavax completed Yes   Shingrix Completed?: No.    Education has been provided regarding the importance of this vaccine. Patient has been advised to call insurance company to determine out of pocket expense if they have not yet received this vaccine. Advised may also receive vaccine at local pharmacy or Health Dept. Verbalized acceptance and understanding.  Screening Tests Health Maintenance  Topic Date Due   COVID-19 Vaccine (1) Never done   Zoster Vaccines- Shingrix (1 of 2) Never done   Pneumonia Vaccine 6+ Years old (2 - PCV) 08/19/2019   FOOT EXAM  11/14/2021   HEMOGLOBIN A1C  02/25/2022   COLONOSCOPY (Pts 45-34yr Insurance coverage will need to be confirmed)  06/29/2022   OPHTHALMOLOGY EXAM  07/04/2022   DEXA SCAN  11/15/2022   MAMMOGRAM  10/11/2023   TETANUS/TDAP  11/20/2030   INFLUENZA VACCINE  Completed   Hepatitis C Screening  Completed   HPV VACCINES  Aged Out  Health Maintenance  Health Maintenance Due  Topic Date Due   COVID-19 Vaccine (1) Never done   Zoster Vaccines- Shingrix (1 of 2) Never done   Pneumonia Vaccine 30+ Years old (2 - PCV) 08/19/2019   FOOT EXAM  11/14/2021    Colorectal cancer screening: Type of screening: Colonoscopy. Completed 06/29/2012. Repeat every 10 years  Mammogram status: Completed 10/10/2021. Repeat every year  Bone Density status: Completed 11/15/2020. Results reflect: Bone density results: NORMAL. Repeat every 2 years.  Lung  Cancer Screening: (Low Dose CT Chest recommended if Age 57-80 years, 30 pack-year currently smoking OR have quit w/in 15years.) does not qualify.    Additional Screening:  Hepatitis C Screening: does qualify; Completed 01/20/2017  Vision Screening: Recommended annual ophthalmology exams for early detection of glaucoma and other disorders of the eye. Is the patient up to date with their annual eye exam?  Yes  Who is the provider or what is the name of the office in which the patient attends annual eye exams? [facility] If pt is not established with a provider, would they like to be referred to a provider to establish care? No .   Dental Screening: Recommended annual dental exams for proper oral hygiene  Community Resource Referral / Chronic Care Management: CRR required this visit?  No   CCM required this visit?  No      Plan:     I have personally reviewed and noted the following in the patients chart:   Medical and social history Use of alcohol, tobacco or illicit drugs  Current medications and supplements including opioid prescriptions.  Functional ability and status Nutritional status Physical activity Advanced directives List of other physicians Hospitalizations, surgeries, and ER visits in previous 12 months Vitals Screenings to include cognitive, depression, and falls Referrals and appointments  In addition, I have reviewed and discussed with patient certain preventive protocols, quality metrics, and best practice recommendations. A written personalized care plan for preventive services as well as general preventive health recommendations were provided to patient.     Sandrea Hammond, LPN   09/07/3006   Nurse Notes: none

## 2022-02-25 ENCOUNTER — Encounter: Payer: Self-pay | Admitting: Family

## 2022-02-25 ENCOUNTER — Ambulatory Visit: Payer: Medicare Other | Admitting: Family

## 2022-03-01 ENCOUNTER — Other Ambulatory Visit: Payer: Self-pay | Admitting: Internal Medicine

## 2022-03-01 DIAGNOSIS — R519 Headache, unspecified: Secondary | ICD-10-CM

## 2022-03-03 ENCOUNTER — Other Ambulatory Visit: Payer: Self-pay | Admitting: Family

## 2022-03-05 ENCOUNTER — Other Ambulatory Visit: Payer: Self-pay | Admitting: Family

## 2022-03-05 DIAGNOSIS — E1165 Type 2 diabetes mellitus with hyperglycemia: Secondary | ICD-10-CM

## 2022-03-05 DIAGNOSIS — I152 Hypertension secondary to endocrine disorders: Secondary | ICD-10-CM

## 2022-03-05 NOTE — Telephone Encounter (Signed)
Thirty day supply given. Patient must be seen for any further refills.  °

## 2022-03-06 ENCOUNTER — Encounter: Payer: Self-pay | Admitting: Family

## 2022-03-06 NOTE — Telephone Encounter (Signed)
LMTCB TO SCHEDULE APPT FOR FURTHER REFILLS/LETTER MAILED ?

## 2022-03-07 ENCOUNTER — Other Ambulatory Visit: Payer: Medicare Other

## 2022-03-18 IMAGING — MG MM DIGITAL SCREENING BILAT W/ TOMO AND CAD
8 series · 8 of 24 positions shown · non-contrast
Comparison: Previous exam(s).

CLINICAL DATA: Screening.

EXAM:
DIGITAL SCREENING BILATERAL MAMMOGRAM WITH TOMOSYNTHESIS AND CAD
TECHNIQUE: Bilateral screening digital craniocaudal and mediolateral oblique
mammograms were obtained. Bilateral screening digital breast
tomosynthesis was performed. The images were evaluated with
computer-aided detection.

[L CC synth-2D]
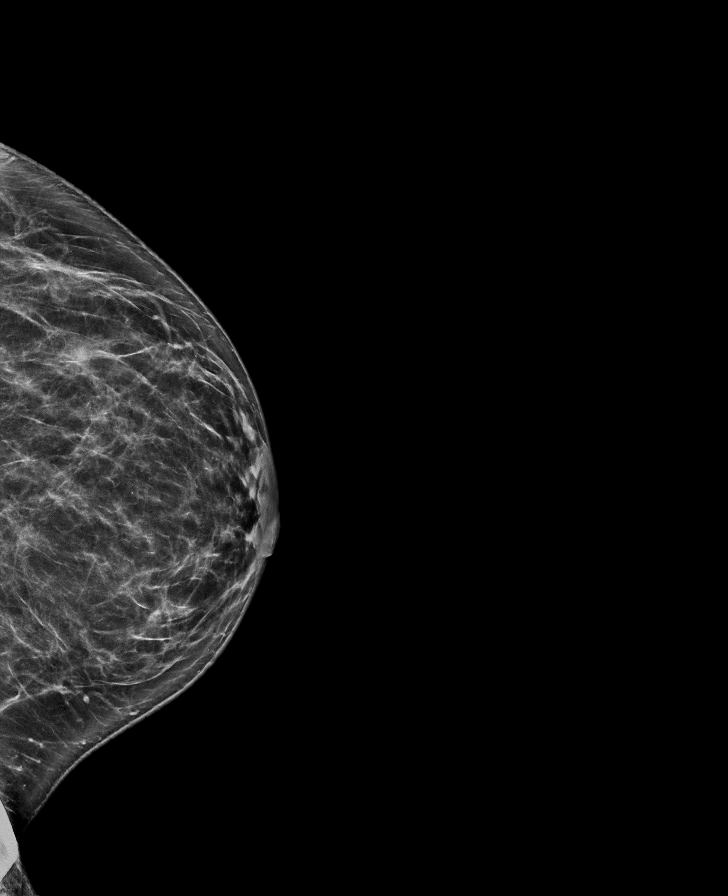

[L MLO synth-2D]
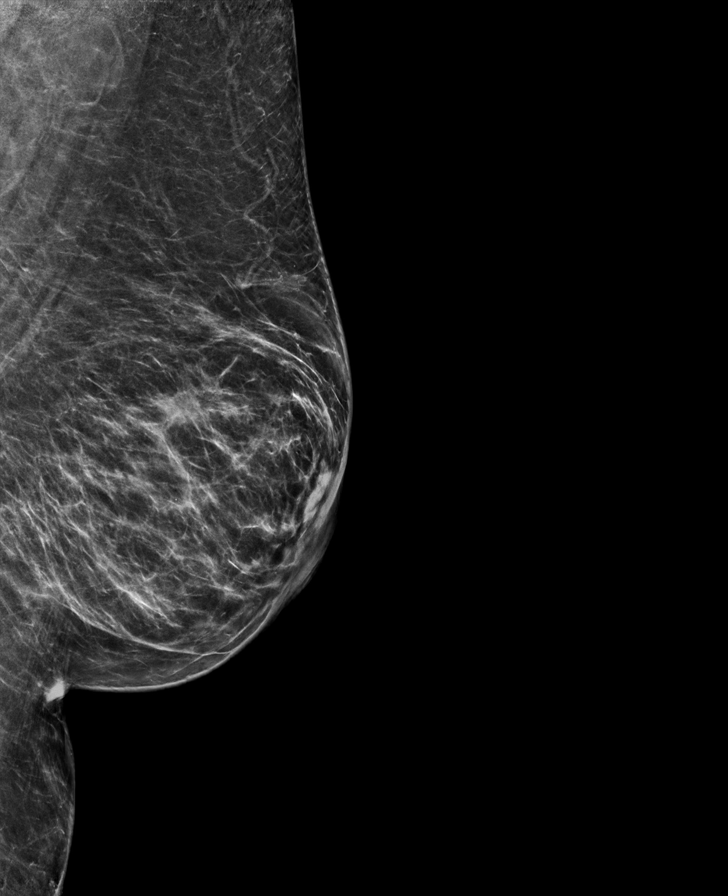

[R MLO synth-2D]
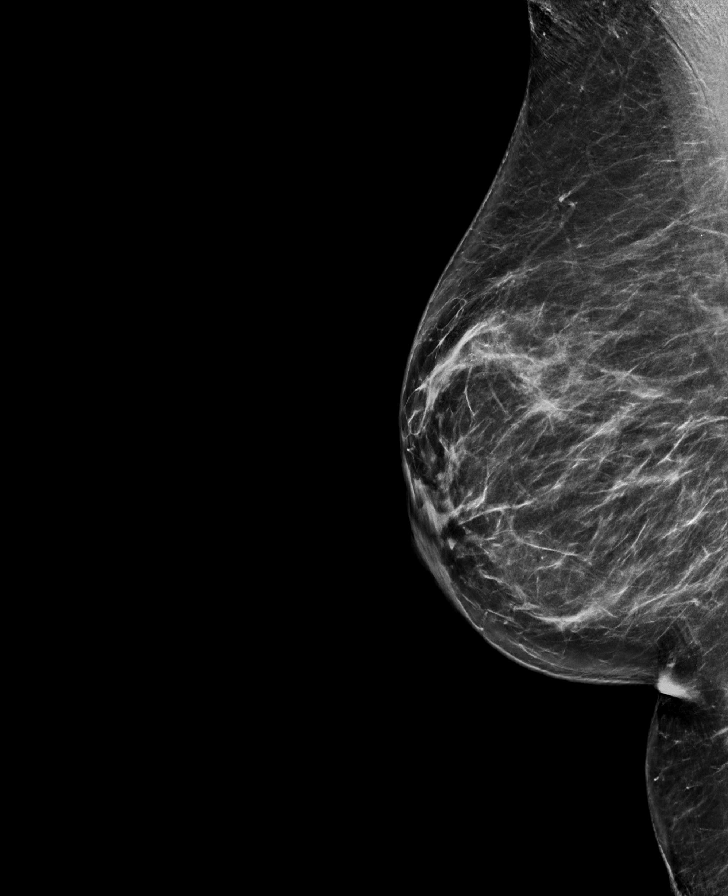

[R CC synth-2D]
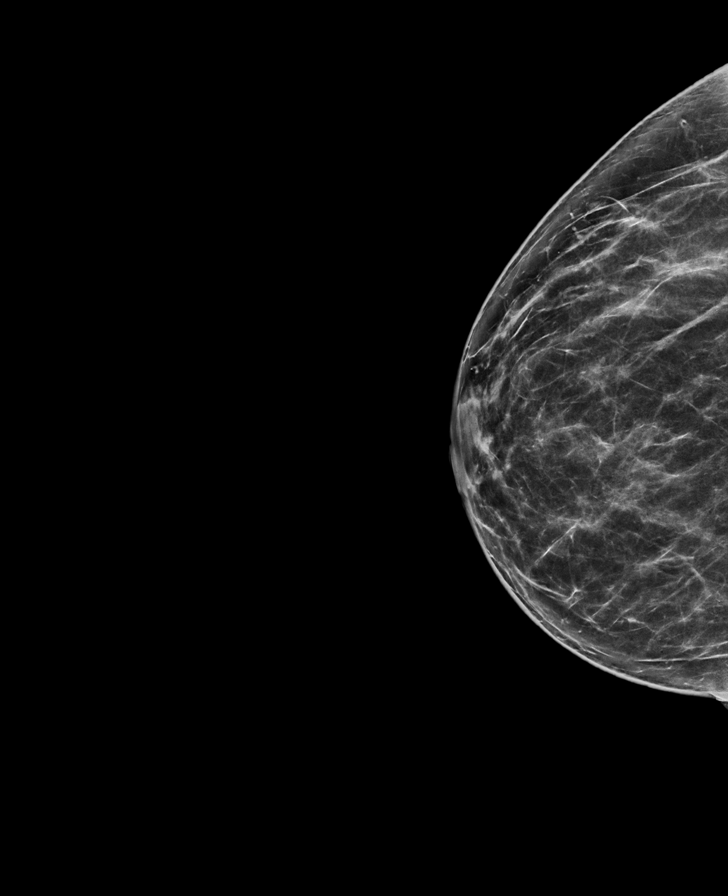

[R CC tomo · tomo slice 37/74.0]
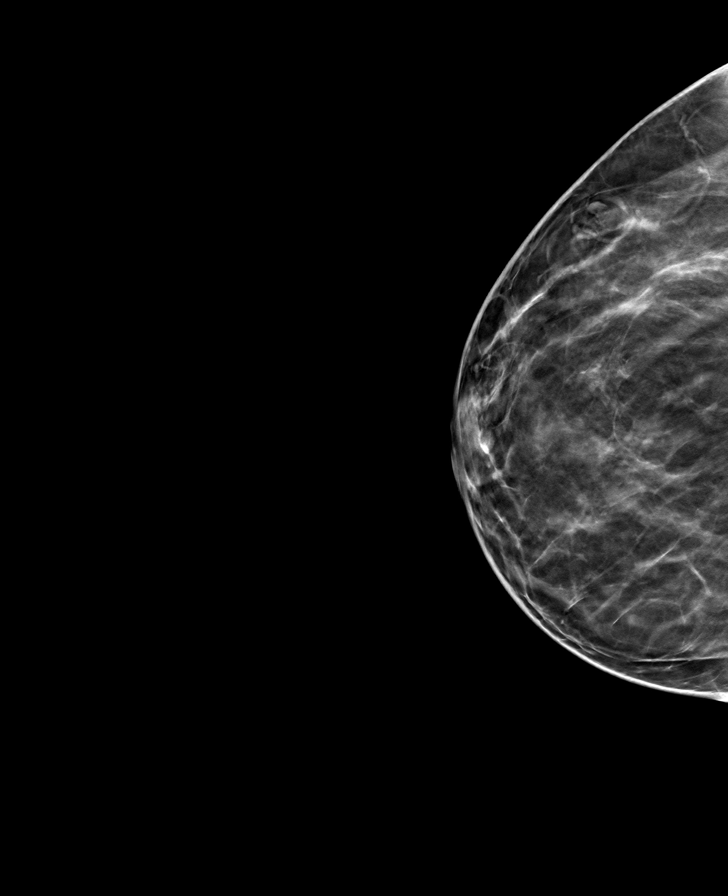

[L CC tomo · tomo slice 37/73.0]
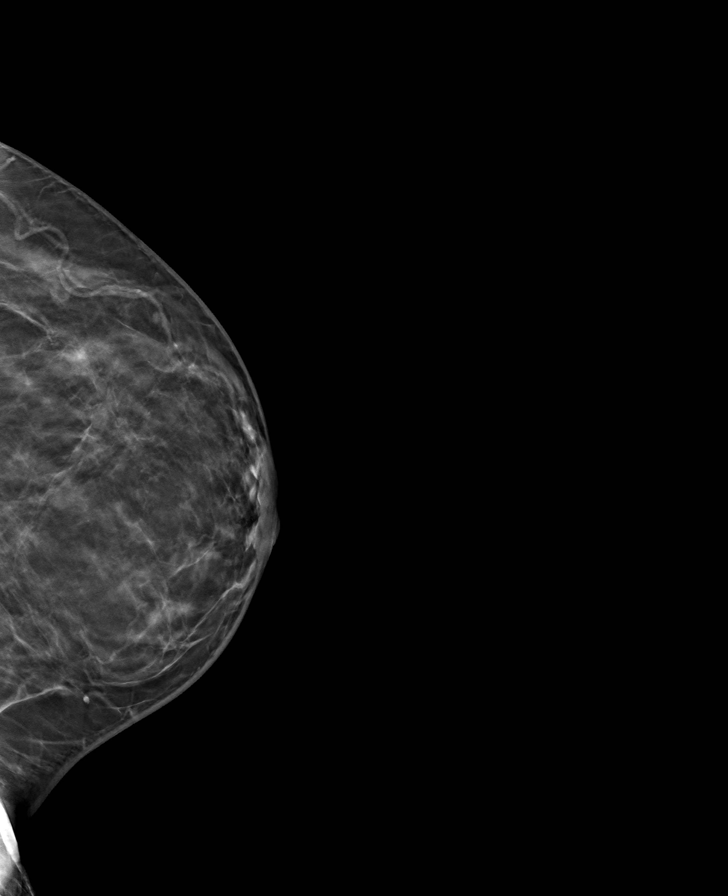

[L MLO tomo · tomo slice 36/71.0]
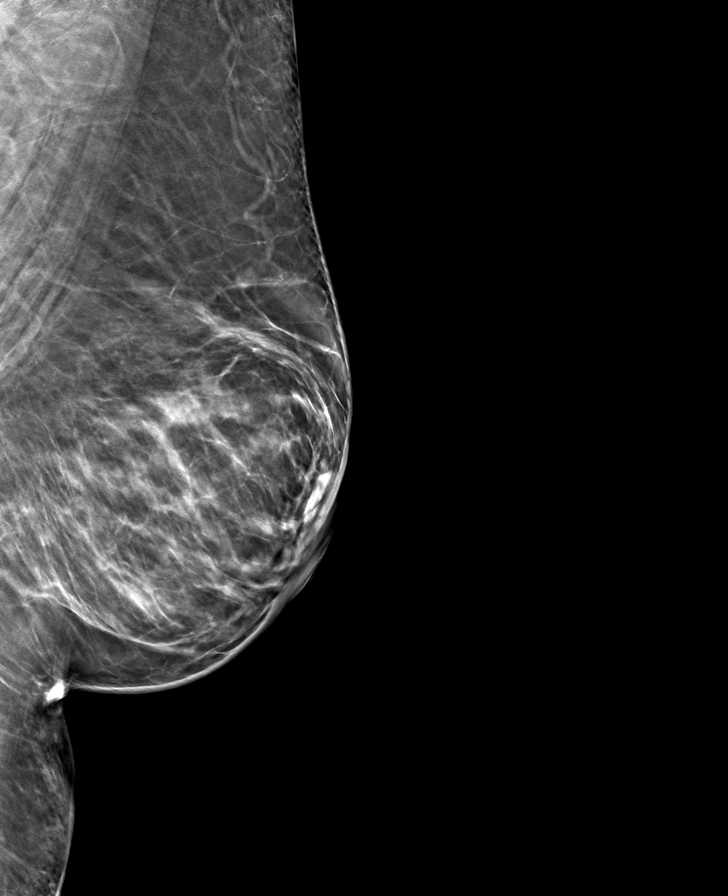

[R MLO tomo · tomo slice 38/75.0]
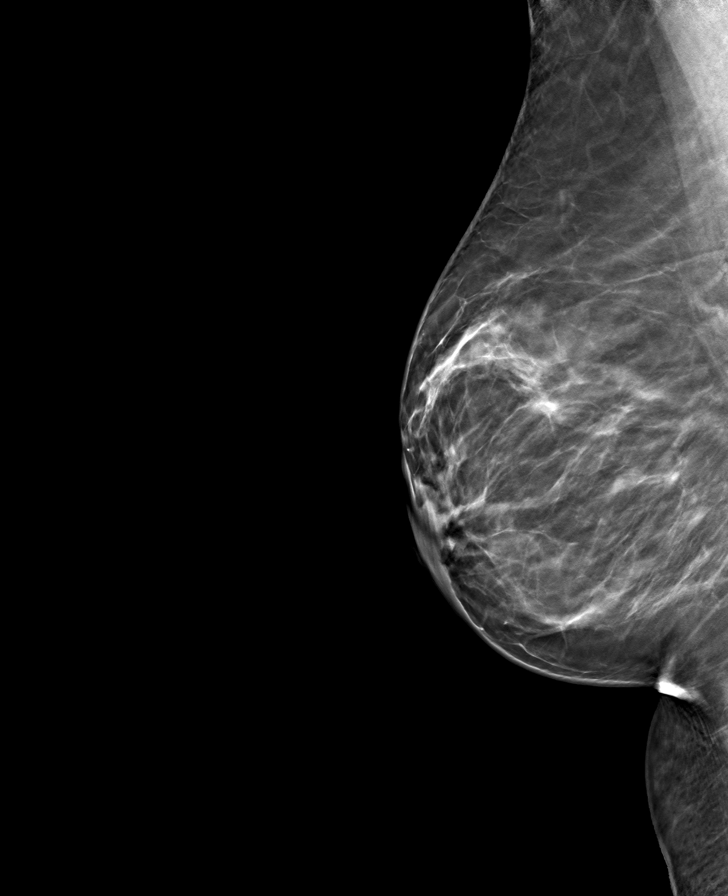

[8 of 24 positions shown; findings below may reference images not displayed]

ACR Breast Density Category b: There are scattered areas of
fibroglandular density.
FINDINGS: There are no findings suspicious for malignancy.
IMPRESSION: No mammographic evidence of malignancy. A result letter of this
screening mammogram will be mailed directly to the patient.

RECOMMENDATION:
Screening mammogram in one year. (Code:51-O-LD2)

BI-RADS CATEGORY  1: Negative.

## 2022-03-28 ENCOUNTER — Other Ambulatory Visit: Payer: Self-pay | Admitting: Family

## 2022-03-28 DIAGNOSIS — E1165 Type 2 diabetes mellitus with hyperglycemia: Secondary | ICD-10-CM

## 2022-03-28 DIAGNOSIS — E1159 Type 2 diabetes mellitus with other circulatory complications: Secondary | ICD-10-CM

## 2022-04-01 ENCOUNTER — Other Ambulatory Visit: Payer: Self-pay | Admitting: Family

## 2022-04-15 ENCOUNTER — Other Ambulatory Visit: Payer: Self-pay | Admitting: Family

## 2022-04-15 ENCOUNTER — Encounter: Payer: Self-pay | Admitting: Family

## 2022-04-15 DIAGNOSIS — E1169 Type 2 diabetes mellitus with other specified complication: Secondary | ICD-10-CM

## 2022-04-15 DIAGNOSIS — E1165 Type 2 diabetes mellitus with hyperglycemia: Secondary | ICD-10-CM

## 2022-04-15 DIAGNOSIS — I152 Hypertension secondary to endocrine disorders: Secondary | ICD-10-CM

## 2022-04-15 NOTE — Telephone Encounter (Signed)
Called patient to schedule appt. Call got disconnected and was not able to reach when attempted to call back. Letter sent.  ?

## 2022-04-15 NOTE — Telephone Encounter (Signed)
ntbs 30 days given  ?

## 2022-04-20 ENCOUNTER — Other Ambulatory Visit: Payer: Self-pay | Admitting: Family

## 2022-04-20 DIAGNOSIS — E1165 Type 2 diabetes mellitus with hyperglycemia: Secondary | ICD-10-CM

## 2022-04-20 DIAGNOSIS — E1159 Type 2 diabetes mellitus with other circulatory complications: Secondary | ICD-10-CM

## 2022-04-22 ENCOUNTER — Encounter: Payer: Self-pay | Admitting: Family

## 2022-04-22 NOTE — Telephone Encounter (Signed)
Patient given a 30 day supply She will need a follow up for any further refills.  ?

## 2022-04-22 NOTE — Telephone Encounter (Signed)
Letter sent.

## 2022-05-09 ENCOUNTER — Other Ambulatory Visit: Payer: Self-pay | Admitting: Family

## 2022-05-09 DIAGNOSIS — E1165 Type 2 diabetes mellitus with hyperglycemia: Secondary | ICD-10-CM

## 2022-05-09 DIAGNOSIS — I152 Hypertension secondary to endocrine disorders: Secondary | ICD-10-CM

## 2022-05-09 NOTE — Telephone Encounter (Signed)
[place]. NTBS 30 days given 04/16/22

## 2022-05-10 ENCOUNTER — Encounter: Payer: Self-pay | Admitting: Family

## 2022-05-10 ENCOUNTER — Other Ambulatory Visit: Payer: Self-pay | Admitting: Family

## 2022-05-10 DIAGNOSIS — E1165 Type 2 diabetes mellitus with hyperglycemia: Secondary | ICD-10-CM

## 2022-05-10 DIAGNOSIS — I152 Hypertension secondary to endocrine disorders: Secondary | ICD-10-CM

## 2022-05-10 NOTE — Telephone Encounter (Signed)
NTBS. 30 day given on 04/22/22

## 2022-05-10 NOTE — Telephone Encounter (Signed)
LMTCB TO SCHEDULE APPT LETTER MAILED 

## 2022-05-15 DIAGNOSIS — E782 Mixed hyperlipidemia: Secondary | ICD-10-CM | POA: Diagnosis not present

## 2022-05-15 DIAGNOSIS — I10 Essential (primary) hypertension: Secondary | ICD-10-CM | POA: Diagnosis not present

## 2022-05-15 DIAGNOSIS — E1169 Type 2 diabetes mellitus with other specified complication: Secondary | ICD-10-CM | POA: Diagnosis not present

## 2022-05-15 DIAGNOSIS — I2510 Atherosclerotic heart disease of native coronary artery without angina pectoris: Secondary | ICD-10-CM | POA: Diagnosis not present

## 2022-05-27 ENCOUNTER — Other Ambulatory Visit: Payer: Self-pay | Admitting: Family

## 2022-05-27 DIAGNOSIS — I152 Hypertension secondary to endocrine disorders: Secondary | ICD-10-CM

## 2022-05-27 DIAGNOSIS — E1165 Type 2 diabetes mellitus with hyperglycemia: Secondary | ICD-10-CM

## 2022-06-13 ENCOUNTER — Other Ambulatory Visit: Payer: Self-pay | Admitting: Family

## 2022-06-13 DIAGNOSIS — E669 Obesity, unspecified: Secondary | ICD-10-CM

## 2022-06-21 ENCOUNTER — Other Ambulatory Visit: Payer: Self-pay | Admitting: Family

## 2022-06-21 DIAGNOSIS — E1159 Type 2 diabetes mellitus with other circulatory complications: Secondary | ICD-10-CM

## 2022-06-21 DIAGNOSIS — E1165 Type 2 diabetes mellitus with hyperglycemia: Secondary | ICD-10-CM

## 2022-06-26 ENCOUNTER — Telehealth: Payer: Self-pay | Admitting: Family

## 2022-06-26 ENCOUNTER — Other Ambulatory Visit: Payer: Self-pay | Admitting: Family

## 2022-06-26 DIAGNOSIS — E1165 Type 2 diabetes mellitus with hyperglycemia: Secondary | ICD-10-CM

## 2022-06-26 NOTE — Telephone Encounter (Signed)
  Prescription Request  06/26/2022  Is this a "Controlled Substance" medicine? metFORMIN (GLUCOPHAGE-XR) 750 MG 24 hr tablet  Have you seen your PCP in the last 2 weeks? No pt has apt 07/12/2022 scheduled  If YES, route message to pool  -  If NO, patient needs to be scheduled for appointment.  What is the name of the medication or equipment? metFORMIN (GLUCOPHAGE-XR) 750 MG 24 hr tablet  Have you contacted your pharmacy to request a refill? Yes   Which pharmacy would you like this sent to? [facility]   Patient notified that their request is being sent to the clinical staff for review and that they should receive a response within 2 business days.

## 2022-06-26 NOTE — Telephone Encounter (Signed)
LMOVM refill has been sent in to pharmacy, done in Rx request pool

## 2022-07-09 ENCOUNTER — Ambulatory Visit: Payer: Medicare Other | Admitting: Family

## 2022-07-11 ENCOUNTER — Other Ambulatory Visit: Payer: Self-pay | Admitting: Family

## 2022-07-11 DIAGNOSIS — E1169 Type 2 diabetes mellitus with other specified complication: Secondary | ICD-10-CM

## 2022-07-12 ENCOUNTER — Ambulatory Visit: Payer: Medicare Other | Admitting: Family

## 2022-07-19 ENCOUNTER — Other Ambulatory Visit: Payer: Self-pay | Admitting: Family

## 2022-07-19 DIAGNOSIS — E1165 Type 2 diabetes mellitus with hyperglycemia: Secondary | ICD-10-CM

## 2022-07-23 ENCOUNTER — Ambulatory Visit (INDEPENDENT_AMBULATORY_CARE_PROVIDER_SITE_OTHER): Payer: Medicare Other | Admitting: Family

## 2022-07-23 ENCOUNTER — Encounter: Payer: Self-pay | Admitting: Family

## 2022-07-23 VITALS — BP 122/69 | HR 77 | Temp 97.7°F | Ht 63.0 in | Wt 143.6 lb

## 2022-07-23 DIAGNOSIS — E785 Hyperlipidemia, unspecified: Secondary | ICD-10-CM

## 2022-07-23 DIAGNOSIS — E1169 Type 2 diabetes mellitus with other specified complication: Secondary | ICD-10-CM | POA: Diagnosis not present

## 2022-07-23 DIAGNOSIS — I25709 Atherosclerosis of coronary artery bypass graft(s), unspecified, with unspecified angina pectoris: Secondary | ICD-10-CM

## 2022-07-23 DIAGNOSIS — F17200 Nicotine dependence, unspecified, uncomplicated: Secondary | ICD-10-CM | POA: Diagnosis not present

## 2022-07-23 DIAGNOSIS — E1159 Type 2 diabetes mellitus with other circulatory complications: Secondary | ICD-10-CM | POA: Diagnosis not present

## 2022-07-23 DIAGNOSIS — I152 Hypertension secondary to endocrine disorders: Secondary | ICD-10-CM | POA: Diagnosis not present

## 2022-07-23 DIAGNOSIS — Z951 Presence of aortocoronary bypass graft: Secondary | ICD-10-CM

## 2022-07-23 LAB — BAYER DCA HB A1C WAIVED: HB A1C (BAYER DCA - WAIVED): 6.5 % — ABNORMAL HIGH (ref 4.8–5.6)

## 2022-07-23 NOTE — Patient Instructions (Signed)

## 2022-07-23 NOTE — Progress Notes (Signed)
Subjective:    Patient ID: Heidi Graham, female    DOB: 02/12/1951, 71 y.o.   MRN: [id number]  Chief Complaint  Patient presents with   Medical Management of Chronic Issues   Pt presents to the office today for chronic follow up.  PT had CABG in 2011 and 2017 is followed by Cardiologists annually. She reports she has lost 38 lbs since starting Ozmepic. However, states her insurance would no longer cover it.  PT states she feels great and is exercising.      07/23/2022    1:43 PM 02/01/2022   11:15 AM 08/28/2021   10:02 AM  Last 3 Weights  Weight (lbs) 143 lb 9.6 oz 140 lb 140 lb 12.8 oz  Weight (kg) 65.137 kg 63.504 kg 63.866 kg     She has atherosclerosis and takes Crestor daily.   She has COPD and continues to smoke 1/2 pack a day. She has intermittent SOB.   Diabetes She presents for her follow-up diabetic visit. She has type 2 diabetes mellitus. Associated symptoms include blurred vision. Pertinent negatives for diabetes include no foot paresthesias. Symptoms are stable. Risk factors for coronary artery disease include dyslipidemia, diabetes mellitus, hypertension and sedentary lifestyle. She is following a generally unhealthy diet. Her overall blood glucose range is 110-130 mg/dl. Eye exam is not current.  Hypertension This is a chronic problem. The current episode started more than 1 year ago. The problem has been resolved since onset. The problem is controlled. Associated symptoms include blurred vision. Pertinent negatives include no malaise/fatigue, peripheral edema or shortness of breath. The current treatment provides moderate improvement. There is no history of heart failure.  Hyperlipidemia This is a chronic problem. The current episode started more than 1 year ago. The problem is controlled. Recent lipid tests were reviewed and are normal. Pertinent negatives include no shortness of breath. Current antihyperlipidemic treatment includes statins. The current treatment provides  moderate improvement of lipids. Risk factors for coronary artery disease include dyslipidemia, hypertension, a sedentary lifestyle, post-menopausal and diabetes mellitus.  Nicotine Dependence Presents for follow-up visit. Her urge triggers include company of smokers. The symptoms have been stable. She smokes < 1/2 a pack of cigarettes per day.      Review of Systems  Constitutional:  Negative for malaise/fatigue.  Eyes:  Positive for blurred vision.  Respiratory:  Negative for shortness of breath.   All other systems reviewed and are negative.      Objective:   Physical Exam Vitals reviewed.  Constitutional:      General: She is not in acute distress.    Appearance: She is well-developed. She is obese.  HENT:     Head: Normocephalic and atraumatic.     Right Ear: Tympanic membrane normal.     Left Ear: Tympanic membrane normal.  Eyes:     Pupils: Pupils are equal, round, and reactive to light.  Neck:     Thyroid: No thyromegaly.  Cardiovascular:     Rate and Rhythm: Normal rate and regular rhythm.     Heart sounds: Normal heart sounds. No murmur heard. Pulmonary:     Effort: Pulmonary effort is normal. No respiratory distress.     Breath sounds: Normal breath sounds. No wheezing.  Abdominal:     General: Bowel sounds are normal. There is no distension.     Palpations: Abdomen is soft.     Tenderness: There is no abdominal tenderness.  Musculoskeletal:        General: No  tenderness. Normal range of motion.     Cervical back: Normal range of motion and neck supple.  Skin:    General: Skin is warm and dry.  Neurological:     Mental Status: She is alert and oriented to person, place, and time.     Cranial Nerves: No cranial nerve deficit.     Deep Tendon Reflexes: Reflexes are normal and symmetric.  Psychiatric:        Behavior: Behavior normal.        Thought Content: Thought content normal.        Judgment: Judgment normal.       BP 122/69   Pulse 77   Temp  97.7 F (36.5 C) (Temporal)   Ht $R'5\' 3"'Ts$  (1.6 m)   Wt 143 lb 9.6 oz (65.1 kg)   BMI 25.44 kg/m      Assessment & Plan:  MAYELA BULLARD comes in today with chief complaint of Medical Management of Chronic Issues   Diagnosis and orders addressed:  1. Hypertension associated with diabetes ([place]) - CMP14+EGFR - CBC with Differential/Platelet  2. Type 2 diabetes mellitus with other specified complication, without long-term current use of insulin ([facility]) - Bayer DCA Hb A1c Waived - CMP14+EGFR - CBC with Differential/Platelet - Microalbumin / creatinine urine ratio  3. Hyperlipidemia associated with type 2 diabetes mellitus ([place])  - CMP14+EGFR - CBC with Differential/Platelet - Lipid panel  4. Hyperlipidemia, unspecified hyperlipidemia type - CMP14+EGFR - CBC with Differential/Platelet  5. Hx of CABG  - CMP14+EGFR - CBC with Differential/Platelet  6. Current smoker - CMP14+EGFR - CBC with Differential/Platelet  7. Atherosclerosis of coronary artery bypass graft with angina pectoris, unspecified whether native or transplanted heart ([place])  - CMP14+EGFR - CBC with Differential/Platelet   Labs pending Health Maintenance reviewed Diet and exercise encouraged  Follow up plan: 6 months   Evelina Dun, FNP

## 2022-07-24 LAB — CMP14+EGFR
ALT: 13 IU/L (ref 0–32)
AST: 15 IU/L (ref 0–40)
Albumin/Globulin Ratio: 2.1 (ref 1.2–2.2)
Albumin: 4.4 g/dL (ref 3.8–4.8)
Alkaline Phosphatase: 69 IU/L (ref 44–121)
BUN/Creatinine Ratio: 13 (ref 12–28)
BUN: 10 mg/dL (ref 8–27)
Bilirubin Total: 0.3 mg/dL (ref 0.0–1.2)
CO2: 22 mmol/L (ref 20–29)
Calcium: 9.3 mg/dL (ref 8.7–10.3)
Chloride: 106 mmol/L (ref 96–106)
Creatinine, Ser: 0.75 mg/dL (ref 0.57–1.00)
Globulin, Total: 2.1 g/dL (ref 1.5–4.5)
Glucose: 111 mg/dL — ABNORMAL HIGH (ref 70–99)
Potassium: 4.6 mmol/L (ref 3.5–5.2)
Sodium: 143 mmol/L (ref 134–144)
Total Protein: 6.5 g/dL (ref 6.0–8.5)
eGFR: 85 mL/min/{1.73_m2} (ref >59)

## 2022-07-24 LAB — CBC WITH DIFFERENTIAL/PLATELET
Basophils Absolute: 0.1 10*3/uL (ref 0.0–0.2)
Basos: 1 %
EOS (ABSOLUTE): 0.1 10*3/uL (ref 0.0–0.4)
Eos: 1 %
Hematocrit: 38.4 % (ref 34.0–46.6)
Hemoglobin: 12.9 g/dL (ref 11.1–15.9)
Immature Grans (Abs): 0 10*3/uL (ref 0.0–0.1)
Immature Granulocytes: 0 %
Lymphocytes Absolute: 2 10*3/uL (ref 0.7–3.1)
Lymphs: 35 %
MCH: 31.6 pg (ref 26.6–33.0)
MCHC: 33.6 g/dL (ref 31.5–35.7)
MCV: 94 fL (ref 79–97)
Monocytes Absolute: 0.4 10*3/uL (ref 0.1–0.9)
Monocytes: 7 %
Neutrophils Absolute: 3.1 10*3/uL (ref 1.4–7.0)
Neutrophils: 56 %
Platelets: 201 10*3/uL (ref 150–450)
RBC: 4.08 x10E6/uL (ref 3.77–5.28)
RDW: 12.8 % (ref 11.7–15.4)
WBC: 5.6 10*3/uL (ref 3.4–10.8)

## 2022-07-24 LAB — LIPID PANEL
Chol/HDL Ratio: 2.7 ratio (ref 0.0–4.4)
Cholesterol, Total: 117 mg/dL (ref 100–199)
HDL: 43 mg/dL (ref >39)
LDL Chol Calc (NIH): 59 mg/dL (ref 0–99)
Triglycerides: 72 mg/dL (ref 0–149)
VLDL Cholesterol Cal: 15 mg/dL (ref 5–40)

## 2022-07-24 LAB — MICROALBUMIN / CREATININE URINE RATIO
Creatinine, Urine: 54.6 mg/dL
Microalb/Creat Ratio: 37 mg/g creat — ABNORMAL HIGH (ref 0–29)
Microalbumin, Urine: 20.2 ug/mL

## 2022-07-27 ENCOUNTER — Other Ambulatory Visit: Payer: Self-pay | Admitting: Family

## 2022-07-27 DIAGNOSIS — E1159 Type 2 diabetes mellitus with other circulatory complications: Secondary | ICD-10-CM

## 2022-08-13 ENCOUNTER — Telehealth: Payer: Self-pay | Admitting: *Deleted

## 2022-08-13 NOTE — Patient Outreach (Signed)
  Care Coordination   08/13/2022 Name: Heidi Graham MRN: [id number] DOB: [birth year]   Care Coordination Outreach Attempts:  An unsuccessful telephone outreach was attempted today to offer the patient information about available care coordination services as a benefit of their health plan.   Follow Up Plan:  Additional outreach attempts will be made to offer the patient care coordination information and services.   Encounter Outcome:  No Answer  Care Coordination Interventions Activated:  Yes   Care Coordination Interventions:  No, not indicated    SIG Gean Maidens BSN RN [facility] [phone]

## 2022-08-19 ENCOUNTER — Other Ambulatory Visit: Payer: Self-pay | Admitting: Family

## 2022-08-19 DIAGNOSIS — E1165 Type 2 diabetes mellitus with hyperglycemia: Secondary | ICD-10-CM

## 2022-08-25 ENCOUNTER — Other Ambulatory Visit: Payer: Self-pay | Admitting: Family

## 2022-08-25 DIAGNOSIS — E1169 Type 2 diabetes mellitus with other specified complication: Secondary | ICD-10-CM

## 2022-09-07 ENCOUNTER — Other Ambulatory Visit: Payer: Self-pay | Admitting: Family

## 2022-09-07 DIAGNOSIS — E1165 Type 2 diabetes mellitus with hyperglycemia: Secondary | ICD-10-CM

## 2022-09-07 DIAGNOSIS — E1169 Type 2 diabetes mellitus with other specified complication: Secondary | ICD-10-CM

## 2022-09-09 ENCOUNTER — Telehealth: Payer: Self-pay | Admitting: Family

## 2022-09-09 MED ORDER — LISINOPRIL 10 MG PO TABS: 10.0000 mg | ORAL_TABLET | Freq: Every day | ORAL | 3 refills | Status: DC

## 2022-09-09 NOTE — Telephone Encounter (Signed)
Patient aware, offered to schedule an appointment in two weeks but patient wants to check calendar and call back.

## 2022-09-09 NOTE — Telephone Encounter (Signed)
Lisinopril 10 mg Prescription sent to pharmacy, needs follow up in 2 weeks.

## 2022-09-09 NOTE — Telephone Encounter (Signed)
Pt called to let PCP know that all nearby pharmacies are out of stock of her Quinapril. Wants to know if PCP can prescribe something else that pharmacy will have in stock.

## 2022-09-18 ENCOUNTER — Other Ambulatory Visit: Payer: Self-pay | Admitting: Family

## 2022-09-18 DIAGNOSIS — Z1231 Encounter for screening mammogram for malignant neoplasm of breast: Secondary | ICD-10-CM

## 2022-09-23 ENCOUNTER — Ambulatory Visit
Admission: RE | Admit: 2022-09-23 | Discharge: 2022-09-23 | Disposition: A | Discharge status: Home / Self Care | Payer: Medicare Other | Source: Ambulatory Visit | Attending: Family | Admitting: Family

## 2022-09-23 ENCOUNTER — Encounter: Payer: Self-pay | Admitting: Family

## 2022-09-23 ENCOUNTER — Ambulatory Visit (INDEPENDENT_AMBULATORY_CARE_PROVIDER_SITE_OTHER): Payer: Medicare Other | Admitting: Family

## 2022-09-23 VITALS — BP 127/66 | HR 67 | Temp 97.2°F | Ht 63.0 in | Wt 150.2 lb

## 2022-09-23 DIAGNOSIS — Z951 Presence of aortocoronary bypass graft: Secondary | ICD-10-CM

## 2022-09-23 DIAGNOSIS — E785 Hyperlipidemia, unspecified: Secondary | ICD-10-CM

## 2022-09-23 DIAGNOSIS — I152 Hypertension secondary to endocrine disorders: Secondary | ICD-10-CM | POA: Diagnosis not present

## 2022-09-23 DIAGNOSIS — E1159 Type 2 diabetes mellitus with other circulatory complications: Secondary | ICD-10-CM | POA: Diagnosis not present

## 2022-09-23 DIAGNOSIS — E1169 Type 2 diabetes mellitus with other specified complication: Secondary | ICD-10-CM

## 2022-09-23 DIAGNOSIS — J42 Unspecified chronic bronchitis: Secondary | ICD-10-CM | POA: Diagnosis not present

## 2022-09-23 DIAGNOSIS — I25709 Atherosclerosis of coronary artery bypass graft(s), unspecified, with unspecified angina pectoris: Secondary | ICD-10-CM

## 2022-09-23 DIAGNOSIS — Z1231 Encounter for screening mammogram for malignant neoplasm of breast: Secondary | ICD-10-CM

## 2022-09-23 DIAGNOSIS — F17200 Nicotine dependence, unspecified, uncomplicated: Secondary | ICD-10-CM | POA: Diagnosis not present

## 2022-09-23 NOTE — Progress Notes (Signed)
Subjective:    Patient ID: Heidi Graham, female    DOB: [birth year], 71 y.o.   MRN: [id number]  Chief Complaint  Patient presents with   Hypertension   Pt presents to the office today to recheck HTN. We changed her Quinapril 10 mg to lisinopril 10 mg. Her BP is at goal today.    PT had CABG in 2011 and 2017 is followed by Cardiologists annually. She reports she has lost 38 lbs since starting Ozmepic. However, states her insurance would no longer cover it.  PT states she feels great and is exercising.   She has atherosclerosis and takes Crestor daily.    She has COPD and continues to smoke 1/2 pack a day. She has intermittent SOB.  Hypertension This is a chronic problem. The current episode started more than 1 year ago. The problem has been resolved since onset. The problem is controlled. Pertinent negatives include no blurred vision, malaise/fatigue, peripheral edema or shortness of breath. Risk factors for coronary artery disease include dyslipidemia and sedentary lifestyle. The current treatment provides moderate improvement.  Diabetes She presents for her follow-up diabetic visit. She has type 2 diabetes mellitus. Pertinent negatives for diabetes include no blurred vision and no foot paresthesias. Symptoms are stable. Pertinent negatives for diabetic complications include no peripheral neuropathy. Risk factors for coronary artery disease include dyslipidemia, diabetes mellitus, hypertension, sedentary lifestyle and post-menopausal. She is following a generally healthy diet. Eye exam is current.  Hyperlipidemia This is a chronic problem. The current episode started more than 1 year ago. The problem is controlled. Recent lipid tests were reviewed and are normal. Pertinent negatives include no shortness of breath. Current antihyperlipidemic treatment includes statins. The current treatment provides moderate improvement of lipids.  Nicotine Dependence Presents for follow-up visit. Her urge  triggers include company of smokers. The symptoms have been stable. She smokes < 1/2 a pack of cigarettes per day.      Review of Systems  Constitutional:  Negative for malaise/fatigue.  Eyes:  Negative for blurred vision.  Respiratory:  Negative for shortness of breath.   All other systems reviewed and are negative.      Objective:   Physical Exam Vitals reviewed.  Constitutional:      General: She is not in acute distress.    Appearance: She is well-developed.  HENT:     Head: Normocephalic and atraumatic.     Right Ear: Tympanic membrane normal.     Left Ear: Tympanic membrane normal.  Eyes:     Pupils: Pupils are equal, round, and reactive to light.  Neck:     Thyroid: No thyromegaly.  Cardiovascular:     Rate and Rhythm: Normal rate and regular rhythm.     Heart sounds: Normal heart sounds. No murmur heard. Pulmonary:     Effort: Pulmonary effort is normal. No respiratory distress.     Breath sounds: Normal breath sounds. No wheezing.  Abdominal:     General: Bowel sounds are normal. There is no distension.     Palpations: Abdomen is soft.     Tenderness: There is no abdominal tenderness.  Musculoskeletal:        General: No tenderness. Normal range of motion.     Cervical back: Normal range of motion and neck supple.  Skin:    General: Skin is warm and dry.  Neurological:     Mental Status: She is alert and oriented to person, place, and time.     Cranial Nerves: No  cranial nerve deficit.     Deep Tendon Reflexes: Reflexes are normal and symmetric.  Psychiatric:        Behavior: Behavior normal.        Thought Content: Thought content normal.        Judgment: Judgment normal.    Diabetic Foot Exam - Simple   Simple Foot Form Diabetic Foot exam was performed with the following findings: Yes 09/23/2022 10:30 AM  Visual Inspection No deformities, no ulcerations, no other skin breakdown bilaterally: Yes Sensation Testing Intact to touch and monofilament  testing bilaterally: Yes Pulse Check Posterior Tibialis and Dorsalis pulse intact bilaterally: Yes Comments Toenails thick         BP 127/66   Pulse 67   Temp (!) 97.2 F (36.2 C) (Temporal)   Ht $R'5\' 3"'yL$  (1.6 m)   Wt 150 lb 3.2 oz (68.1 kg)   SpO2 99%   BMI 26.61 kg/m      Assessment & Plan:  TAEGEN LENNOX comes in today with chief complaint of Hypertension   Diagnosis and orders addressed:  1. Hypertension associated with diabetes ([place]) - CMP14+EGFR  2. Type 2 diabetes mellitus with other specified complication, without long-term current use of insulin ([facility]) - CMP14+EGFR  3. Atherosclerosis of coronary artery bypass graft with angina pectoris, unspecified whether native or transplanted heart ([place]) - CMP14+EGFR  4. Hyperlipidemia associated with type 2 diabetes mellitus ([facility]) - CMP14+EGFR  5. Current smoker - CMP14+EGFR  6. Hx of CABG - CMP14+EGFR  7. Chronic bronchitis, unspecified chronic bronchitis type ([place]) - CMP14+EGFR   Labs pending Health Maintenance reviewed Diet and exercise encouraged  Follow up plan:  4  months  Evelina Dun, FNP

## 2022-09-23 NOTE — Patient Instructions (Signed)
Health Maintenance After Age 71 After age 71, you are at a higher risk for certain long-term diseases and infections as well as injuries from falls. Falls are a major cause of broken bones and head injuries in people who are older than age 71. Getting regular preventive care can help to keep you healthy and well. Preventive care includes getting regular testing and making lifestyle changes as recommended by your health care provider. Talk with your health care provider about: Which screenings and tests you should have. A screening is a test that checks for a disease when you have no symptoms. A diet and exercise plan that is right for you. What should I know about screenings and tests to prevent falls? Screening and testing are the best ways to find a health problem early. Early diagnosis and treatment give you the best chance of managing medical conditions that are common after age 71. Certain conditions and lifestyle choices may make you more likely to have a fall. Your health care provider may recommend: Regular vision checks. Poor vision and conditions such as cataracts can make you more likely to have a fall. If you wear glasses, make sure to get your prescription updated if your vision changes. Medicine review. Work with your health care provider to regularly review all of the medicines you are taking, including over-the-counter medicines. Ask your health care provider about any side effects that may make you more likely to have a fall. Tell your health care provider if any medicines that you take make you feel dizzy or sleepy. Strength and balance checks. Your health care provider may recommend certain tests to check your strength and balance while standing, walking, or changing positions. Foot health exam. Foot pain and numbness, as well as not wearing proper footwear, can make you more likely to have a fall. Screenings, including: Osteoporosis screening. Osteoporosis is a condition that causes  the bones to get weaker and break more easily. Blood pressure screening. Blood pressure changes and medicines to control blood pressure can make you feel dizzy. Depression screening. You may be more likely to have a fall if you have a fear of falling, feel depressed, or feel unable to do activities that you used to do. Alcohol use screening. Using too much alcohol can affect your balance and may make you more likely to have a fall. Follow these instructions at home: Lifestyle Do not drink alcohol if: Your health care provider tells you not to drink. If you drink alcohol: Limit how much you have to: 0-1 drink a day for women. 0-2 drinks a day for men. Know how much alcohol is in your drink. In the [place] one drink equals one 12 oz bottle of beer (355 mL), one 5 oz glass of wine (148 mL), or one 1 oz glass of hard liquor (44 mL). Do not use any products that contain nicotine or tobacco. These products include cigarettes, chewing tobacco, and vaping devices, such as e-cigarettes. If you need help quitting, ask your health care provider. Activity  Follow a regular exercise program to stay fit. This will help you maintain your balance. Ask your health care provider what types of exercise are appropriate for you. If you need a cane or walker, use it as recommended by your health care provider. Wear supportive shoes that have nonskid soles. Safety  Remove any tripping hazards, such as rugs, cords, and clutter. Install safety equipment such as grab bars in bathrooms and safety rails on stairs. Keep rooms and walkways   well-lit. General instructions Talk with your health care provider about your risks for falling. Tell your health care provider if: You fall. Be sure to tell your health care provider about all falls, even ones that seem minor. You feel dizzy, tiredness (fatigue), or off-balance. Take over-the-counter and prescription medicines only as told by your health care provider. These include  supplements. Eat a healthy diet and maintain a healthy weight. A healthy diet includes low-fat dairy products, low-fat (lean) meats, and fiber from whole grains, beans, and lots of fruits and vegetables. Stay current with your vaccines. Schedule regular health, dental, and eye exams. Summary Having a healthy lifestyle and getting preventive care can help to protect your health and wellness after age 71. Screening and testing are the best way to find a health problem early and help you avoid having a fall. Early diagnosis and treatment give you the best chance for managing medical conditions that are more common for people who are older than age 71. Falls are a major cause of broken bones and head injuries in people who are older than age 71. Take precautions to prevent a fall at home. Work with your health care provider to learn what changes you can make to improve your health and wellness and to prevent falls. This information is not intended to replace advice given to you by your health care provider. Make sure you discuss any questions you have with your health care provider. Document Revised: 04/23/2021 Document Reviewed: 04/23/2021 Elsevier Patient Education  2023 Elsevier Inc.  

## 2022-09-24 LAB — CMP14+EGFR
ALT: 8 IU/L (ref 0–32)
AST: 13 IU/L (ref 0–40)
Albumin/Globulin Ratio: 2.2 (ref 1.2–2.2)
Albumin: 4.3 g/dL (ref 3.8–4.8)
Alkaline Phosphatase: 70 IU/L (ref 44–121)
BUN/Creatinine Ratio: 22 (ref 12–28)
BUN: 16 mg/dL (ref 8–27)
Bilirubin Total: 0.3 mg/dL (ref 0.0–1.2)
CO2: 21 mmol/L (ref 20–29)
Calcium: 9.4 mg/dL (ref 8.7–10.3)
Chloride: 101 mmol/L (ref 96–106)
Creatinine, Ser: 0.73 mg/dL (ref 0.57–1.00)
Globulin, Total: 2 g/dL (ref 1.5–4.5)
Glucose: 131 mg/dL — ABNORMAL HIGH (ref 70–99)
Potassium: 4.9 mmol/L (ref 3.5–5.2)
Sodium: 141 mmol/L (ref 134–144)
Total Protein: 6.3 g/dL (ref 6.0–8.5)
eGFR: 88 mL/min/{1.73_m2} (ref >59)

## 2022-09-25 ENCOUNTER — Other Ambulatory Visit: Payer: Self-pay | Admitting: *Deleted

## 2022-09-25 NOTE — Patient Outreach (Signed)
  Care Coordination   09/25/2022  Name: ALIVIAH SPAIN MRN: [id number] DOB: [birth year]   Care Coordination Outreach Attempts:  A second unsuccessful outreach was attempted today to offer the patient with information about available care coordination services as a benefit of their health plan.   HIPAA compliant messages left on voicemail, providing contact information for CSW, encouraging patient to return CSW's call at her earliest convenience.   Follow Up Plan:  Additional outreach attempts will be made to offer the patient care coordination information and services.    Encounter Outcome:  No Answer.    Care Coordination Interventions Activated:  No.     Care Coordination Interventions:  No, not indicated.     Nat Christen, BSW, MSW, LCSW  Licensed Education officer, [facility]  Mailing [place] [address], [place], [place] [address] Physical Address-[address] [place], [place] [address] Toll Free Main # [phone] Fax # [phone] Cell # [phone] [email or web address]

## 2022-10-02 ENCOUNTER — Ambulatory Visit: Payer: Medicare Other | Admitting: *Deleted

## 2022-10-02 ENCOUNTER — Encounter: Payer: Self-pay | Admitting: *Deleted

## 2022-10-02 NOTE — Patient Outreach (Signed)
  Care Coordination   Initial Visit Note   10/02/2022  Name: Heidi Graham MRN: [id number] DOB: [birth year]  Heidi Graham is a 71 y.o. year old female who sees Heidi Graham, Heidi Hawthorne, FNP for primary care. I spoke with Heidi Graham by phone today.  What matters to the patients health and wellness today?  No Interventions Identified.   SDOH assessments and interventions completed:  Yes.  SDOH Interventions Today    Flowsheet Row Most Recent Value  SDOH Interventions   Food Insecurity Interventions Intervention Not Indicated  Housing Interventions Intervention Not Indicated  Transportation Interventions Intervention Not Indicated  Utilities Interventions Intervention Not Indicated  Alcohol Usage Interventions Intervention Not Indicated (Score <7)  Financial Strain Interventions Intervention Not Indicated  Physical Activity Interventions Intervention Not Indicated  Stress Interventions Intervention Not Indicated  Social Connections Interventions Intervention Not Indicated     Care Coordination Interventions Activated:  Yes.   Care Coordination Interventions:  Yes, provided.   Follow up plan: No further intervention required.   Encounter Outcome:  Pt. Visit Completed.   Heidi Graham, BSW, MSW, LCSW  Licensed Education officer, environmental Health System  Mailing [place] [address] [place], [place] [address] Physical Address-[address], [place], [place] [address] Toll Free Main # [phone] Fax # [phone] Cell # [phone] [email or web address]

## 2022-10-02 NOTE — Patient Instructions (Signed)
Visit Information  Thank you for taking time to visit with me today. Please don't hesitate to contact me if I can be of assistance to you.   Please call the care guide team at [phone] if you need to cancel or reschedule your appointment.   If you are experiencing a Mental Health or Behavioral Health Crisis or need someone to talk to, please call the Suicide and Crisis Lifeline: 988 call the [place] National Suicide Prevention Lifeline: 1-800-273-8255 or TTY: 1-800-799-4 TTY (1-800-799-4889) to talk to a trained counselor call 1-800-273-TALK (toll free, 24 hour hotline) go to [facility] [address], [place] ([phone]) call the [place] Crisis Line: 800-939-9988 call 911  Patient verbalizes understanding of instructions and care plan provided today and agrees to view in MyChart. Active MyChart status and patient understanding of how to access instructions and care plan via MyChart confirmed with patient.     No further follow up required.  Wadie Liew, BSW, MSW, LCSW  Licensed Clinical Social Worker  [facility]  Mailing Address-[address], [place], [place] [address] Physical Address-[address], , [place] [address] Toll Free Main # [phone] Fax # [phone] Cell # [phone] [email or web address]            

## 2022-10-21 ENCOUNTER — Ambulatory Visit
Admission: RE | Admit: 2022-10-21 | Discharge: 2022-10-21 | Disposition: A | Discharge status: Home / Self Care | Payer: Medicare Other | Source: Ambulatory Visit | Attending: Family | Admitting: Family

## 2022-10-21 DIAGNOSIS — Z1231 Encounter for screening mammogram for malignant neoplasm of breast: Secondary | ICD-10-CM | POA: Diagnosis not present

## 2022-10-24 ENCOUNTER — Other Ambulatory Visit: Payer: Self-pay | Admitting: Family

## 2022-10-24 DIAGNOSIS — R928 Other abnormal and inconclusive findings on diagnostic imaging of breast: Secondary | ICD-10-CM

## 2022-12-10 DIAGNOSIS — R059 Cough, unspecified: Secondary | ICD-10-CM | POA: Diagnosis not present

## 2022-12-17 ENCOUNTER — Ambulatory Visit
Admission: RE | Admit: 2022-12-17 | Discharge: 2022-12-17 | Disposition: A | Discharge status: Home / Self Care | Payer: Medicare Other | Source: Ambulatory Visit | Attending: Family | Admitting: Family

## 2022-12-17 ENCOUNTER — Ambulatory Visit: Payer: Medicare Other

## 2022-12-17 DIAGNOSIS — R928 Other abnormal and inconclusive findings on diagnostic imaging of breast: Secondary | ICD-10-CM

## 2022-12-17 DIAGNOSIS — N6489 Other specified disorders of breast: Secondary | ICD-10-CM | POA: Diagnosis not present

## 2023-01-04 DIAGNOSIS — H60502 Unspecified acute noninfective otitis externa, left ear: Secondary | ICD-10-CM | POA: Diagnosis not present

## 2023-01-09 ENCOUNTER — Encounter: Payer: Self-pay | Admitting: Family Medicine

## 2023-01-09 ENCOUNTER — Ambulatory Visit (INDEPENDENT_AMBULATORY_CARE_PROVIDER_SITE_OTHER): Payer: 59 | Admitting: Family Medicine

## 2023-01-09 VITALS — BP 125/62 | HR 68 | Temp 97.6°F | Ht 63.0 in | Wt 156.5 lb

## 2023-01-09 DIAGNOSIS — H65192 Other acute nonsuppurative otitis media, left ear: Secondary | ICD-10-CM

## 2023-01-09 MED ORDER — AMOXICILLIN-POT CLAVULANATE 875-125 MG PO TABS: 1.0000 | ORAL_TABLET | Freq: Two times a day (BID) | ORAL | 0 refills | Status: AC

## 2023-01-09 NOTE — Progress Notes (Signed)
   Acute Office Visit  Subjective:     Patient ID: Heidi Graham, female    DOB: 02/18/1951, 72 y.o.   MRN: [id number]  Chief Complaint  Patient presents with   Otalgia    Otalgia  There is pain in the left ear. This is a new (4 days) problem. Episode onset: 4 days. The problem has been gradually worsening. There has been no fever. The pain is moderate. Pertinent negatives include no coughing, diarrhea, ear discharge, headaches, hearing loss, neck pain or sore throat. Treatments tried: ciprodex. The treatment provided no relief.    Review of Systems  HENT:  Positive for ear pain. Negative for ear discharge, hearing loss and sore throat.   Respiratory:  Negative for cough.   Gastrointestinal:  Negative for diarrhea.  Musculoskeletal:  Negative for neck pain.  Neurological:  Negative for headaches.      Objective:    BP 125/62   Pulse 68   Temp 97.6 F (36.4 C) (Temporal)   Ht 5\' 3"  (1.6 m)   Wt 156 lb 8 oz (71 kg)   SpO2 97%   BMI 27.72 kg/m    Physical Exam Vitals and nursing note reviewed.  Constitutional:      General: She is not in acute distress.    Appearance: She is not ill-appearing, toxic-appearing or diaphoretic.  HENT:     Right Ear: Ear canal and external ear normal.     Left Ear: Ear canal and external ear normal. No drainage, swelling or tenderness. A middle ear effusion is present. Tympanic membrane is erythematous and bulging. Tympanic membrane is not perforated or retracted.  Musculoskeletal:     Right lower leg: No edema.     Left lower leg: No edema.  Skin:    General: Skin is warm and dry.  Neurological:     General: No focal deficit present.     Mental Status: She is alert and oriented to person, place, and time.  Psychiatric:        Mood and Affect: Mood normal.        Behavior: Behavior normal.     No results found for any visits on 01/09/23.      Assessment & Plan:   Edessa was seen today for otalgia.  Diagnoses and all orders  for this visit:  Acute otitis media with effusion of left ear Augmentin as below. Discussed tylenol for pain. Return to office for new or worsening symptoms, or if symptoms persist.  -     amoxicillin-clavulanate (AUGMENTIN) 875-125 MG tablet; Take 1 tablet by mouth 2 (two) times daily for 7 days.  Return to office for new or worsening symptoms, or if symptoms persist.   The patient indicates understanding of these issues and agrees with the plan.   Gwenlyn Perking, FNP

## 2023-01-21 ENCOUNTER — Telehealth: Payer: Self-pay | Admitting: Family

## 2023-01-21 NOTE — Telephone Encounter (Signed)
Left message for patient to call back and schedule Medicare Annual Wellness Visit (AWV) to be completed by video or phone.   Last AWV: 02/01/2022   Please schedule at anytime with [place]     Any questions, please contact me at [phone]   Thank you,   [facility] for [place] Are. We Are. [facility] ??[phone] or ??[phone]

## 2023-01-23 ENCOUNTER — Encounter: Payer: Self-pay | Admitting: Family

## 2023-01-23 ENCOUNTER — Ambulatory Visit (INDEPENDENT_AMBULATORY_CARE_PROVIDER_SITE_OTHER): Payer: 59 | Admitting: Family

## 2023-01-23 VITALS — BP 134/62 | HR 65 | Temp 97.2°F | Ht 63.0 in | Wt 156.4 lb

## 2023-01-23 DIAGNOSIS — J42 Unspecified chronic bronchitis: Secondary | ICD-10-CM | POA: Diagnosis not present

## 2023-01-23 DIAGNOSIS — Z951 Presence of aortocoronary bypass graft: Secondary | ICD-10-CM

## 2023-01-23 DIAGNOSIS — Z0001 Encounter for general adult medical examination with abnormal findings: Secondary | ICD-10-CM

## 2023-01-23 DIAGNOSIS — E1159 Type 2 diabetes mellitus with other circulatory complications: Secondary | ICD-10-CM

## 2023-01-23 DIAGNOSIS — Z0000 Encounter for general adult medical examination without abnormal findings: Secondary | ICD-10-CM

## 2023-01-23 DIAGNOSIS — I25709 Atherosclerosis of coronary artery bypass graft(s), unspecified, with unspecified angina pectoris: Secondary | ICD-10-CM | POA: Diagnosis not present

## 2023-01-23 DIAGNOSIS — Z23 Encounter for immunization: Secondary | ICD-10-CM | POA: Diagnosis not present

## 2023-01-23 DIAGNOSIS — I152 Hypertension secondary to endocrine disorders: Secondary | ICD-10-CM

## 2023-01-23 DIAGNOSIS — E1169 Type 2 diabetes mellitus with other specified complication: Secondary | ICD-10-CM | POA: Diagnosis not present

## 2023-01-23 DIAGNOSIS — E785 Hyperlipidemia, unspecified: Secondary | ICD-10-CM | POA: Diagnosis not present

## 2023-01-23 DIAGNOSIS — F17200 Nicotine dependence, unspecified, uncomplicated: Secondary | ICD-10-CM

## 2023-01-23 LAB — BAYER DCA HB A1C WAIVED: HB A1C (BAYER DCA - WAIVED): 8 % — ABNORMAL HIGH (ref 4.8–5.6)

## 2023-01-23 MED ORDER — METFORMIN HCL ER 750 MG PO TB24: ORAL_TABLET | ORAL | 1 refills | Status: DC

## 2023-01-23 MED ORDER — ROSUVASTATIN CALCIUM 10 MG PO TABS: 10.0000 mg | ORAL_TABLET | Freq: Every day | ORAL | 1 refills | Status: DC

## 2023-01-23 MED ORDER — ISOSORBIDE MONONITRATE ER 30 MG PO TB24: 30.0000 mg | ORAL_TABLET | Freq: Every day | ORAL | 0 refills | Status: DC

## 2023-01-23 MED ORDER — METOPROLOL SUCCINATE ER 25 MG PO TB24: ORAL_TABLET | ORAL | 5 refills | Status: DC

## 2023-01-23 MED ORDER — EMPAGLIFLOZIN 25 MG PO TABS: 25.0000 mg | ORAL_TABLET | Freq: Every day | ORAL | 1 refills | Status: DC

## 2023-01-23 NOTE — Progress Notes (Signed)
Subjective:    Patient ID: Heidi Graham, female    DOB: 01-17-1951, 72 y.o.   MRN: [id number]  Chief Complaint  Patient presents with   Medical Management of Chronic Issues    Wants to go back on ozempic    Pt presents to the office today for CPE and chronic follow up.  PT had CABG in 2011 and 2017 is followed by Cardiologists annually.   She has atherosclerosis and takes Crestor daily.    She has COPD and continues to smoke 1/2 pack a day. She has intermittent SOB.  Hypertension This is a chronic problem. The current episode started more than 1 year ago. The problem has been resolved since onset. The problem is controlled. Pertinent negatives include no blurred vision, malaise/fatigue, peripheral edema or shortness of breath. Risk factors for coronary artery disease include dyslipidemia and diabetes mellitus. The current treatment provides moderate improvement.  Hyperlipidemia This is a chronic problem. The current episode started more than 1 year ago. The problem is controlled. Recent lipid tests were reviewed and are normal. Pertinent negatives include no shortness of breath. Current antihyperlipidemic treatment includes statins. The current treatment provides moderate improvement of lipids. Risk factors for coronary artery disease include dyslipidemia, diabetes mellitus, hypertension, a sedentary lifestyle and post-menopausal.  Diabetes She presents for her follow-up diabetic visit. She has type 2 diabetes mellitus. Pertinent negatives for diabetes include no blurred vision and no foot paresthesias. Symptoms are stable. Risk factors for coronary artery disease include diabetes mellitus, dyslipidemia, hypertension and sedentary lifestyle. She is following a generally healthy diet. Her overall blood glucose range is 110-130 mg/dl. Eye exam is not current.  Nicotine Dependence Presents for follow-up visit. Her urge triggers include company of smokers. The symptoms have been stable. She  smokes < 1/2 a pack of cigarettes per day.      Review of Systems  Constitutional:  Negative for malaise/fatigue.  Eyes:  Negative for blurred vision.  Respiratory:  Negative for shortness of breath.   All other systems reviewed and are negative.  Family History  Problem Relation Age of Onset   Diabetes Mother    Hypertension Mother    Diabetes Sister        type 2   Hypertension Sister    Diabetes Daughter    Diabetes Brother        type 1   Diabetes Son    Breast cancer Neg Hx    Social History   Socioeconomic History   Marital status: Widowed    Spouse name: Not on file   Number of children: 3   Years of education: 79   Highest education level: 12th grade  Occupational History   Occupation: retired   Tobacco Use   Smoking status: Former    Packs/day: 0.30    Years: 28.00    Total pack years: 8.40    Types: Cigarettes    Quit date: 01/16/2022    Years since quitting: 1.0    Passive exposure: Past   Smokeless tobacco: Never   Tobacco comments:    smoked for 20 years - quit for 9 years and restarted about 6 yeasr ago.    Quit again 01/16/2022  Vaping Use   Vaping Use: Never used  Substance and Sexual Activity   Alcohol use: No   Drug use: No   Sexual activity: Not Currently    Partners: Male  Other Topics Concern   Not on file  Social History Narrative  Lives with daughter and 2 grandchildren    Son is truck Geophysicist/field seismologist and lives in [place]   Other son in [place], [place]   Social Determinants of Health   Financial Resource Strain: Low Risk  (10/02/2022)   Overall Financial Resource Strain (CARDIA)    Difficulty of Paying Living Expenses: Not hard at all  Food Insecurity: No Food Insecurity (10/02/2022)   Hunger Vital Sign    Worried About Running Out of Food in the Last Year: Never true    Ran Out of Food in the Last Year: Never true  Transportation Needs: No Transportation Needs (10/02/2022)   PRAPARE - Hydrologist  (Medical): No    Lack of Transportation (Non-Medical): No  Physical Activity: Sufficiently Active (10/02/2022)   Exercise Vital Sign    Days of Exercise per Week: 7 days    Minutes of Exercise per Session: 30 min  Stress: No Stress Concern Present (10/02/2022)   [place]    Feeling of Stress : Not at all  Social Connections: Moderately Integrated (10/02/2022)   Social Connection and Isolation Panel [NHANES]    Frequency of Communication with Friends and Family: More than three times a week    Frequency of Social Gatherings with Friends and Family: More than three times a week    Attends Religious Services: More than 4 times per year    Active Member of [employer] or Organizations: Yes    Attends Archivist Meetings: More than 4 times per year    Marital Status: Widowed        Objective:   Physical Exam Vitals reviewed.  Constitutional:      General: She is not in acute distress.    Appearance: She is well-developed.  HENT:     Head: Normocephalic and atraumatic.     Right Ear: Tympanic membrane normal.     Left Ear: Tympanic membrane normal.  Eyes:     Pupils: Pupils are equal, round, and reactive to light.  Neck:     Thyroid: No thyromegaly.  Cardiovascular:     Rate and Rhythm: Normal rate and regular rhythm.     Heart sounds: Normal heart sounds. No murmur heard. Pulmonary:     Effort: Pulmonary effort is normal. No respiratory distress.     Breath sounds: Rhonchi present. No wheezing.  Abdominal:     General: Bowel sounds are normal. There is no distension.     Palpations: Abdomen is soft.     Tenderness: There is no abdominal tenderness.  Musculoskeletal:        General: No tenderness. Normal range of motion.     Cervical back: Normal range of motion and neck supple.  Skin:    General: Skin is warm and dry.  Neurological:     Mental Status: She is alert and oriented to person, place, and  time.     Cranial Nerves: No cranial nerve deficit.     Deep Tendon Reflexes: Reflexes are normal and symmetric.  Psychiatric:        Behavior: Behavior normal.        Thought Content: Thought content normal.        Judgment: Judgment normal.       BP 134/62   Pulse 65   Temp (!) 97.2 F (36.2 C) (Temporal)   Ht 5\' 3"  (1.6 m)   Wt 156 lb 6.4 oz (70.9 kg)   SpO2  98%   BMI 27.71 kg/m      Assessment & Plan:  Heidi Graham comes in today with chief complaint of Medical Management of Chronic Issues (Wants to go back on ozempic )   Diagnosis and orders addressed:  1. Hx of CABG - isosorbide mononitrate (IMDUR) 30 MG 24 hr tablet; Take 1 tablet (30 mg total) by mouth daily.  Dispense: 90 tablet; Refill: 0 - CMP14+EGFR - CBC with Differential/Platelet  2. Type 2 diabetes mellitus with other specified complication, without long-term current use of insulin (HCC) - empagliflozin (JARDIANCE) 25 MG TABS tablet; Take 1 tablet (25 mg total) by mouth daily.  Dispense: 90 tablet; Refill: 1 - metFORMIN (GLUCOPHAGE-XR) 750 MG 24 hr tablet; TAKE 2 TABLETS (1,500 MG TOTAL) BY MOUTH EVERY DAY WITH BREAKFAST  Dispense: 180 tablet; Refill: 1 - Bayer DCA Hb A1c Waived - CMP14+EGFR - CBC with Differential/Platelet  3. Hypertension associated with diabetes ([place]) - metoprolol succinate (TOPROL-XL) 25 MG 24 hr tablet; TAKE 1 TABLET BY MOUTH EVERY DAy  Dispense: 30 tablet; Refill: 5 - CMP14+EGFR - CBC with Differential/Platelet  4. Hyperlipidemia associated with type 2 diabetes mellitus (HCC) - rosuvastatin (CRESTOR) 10 MG tablet; Take 1 tablet (10 mg total) by mouth daily.  Dispense: 90 tablet; Refill: 1 - CMP14+EGFR - CBC with Differential/Platelet - Lipid panel  5. Annual physical exam - Bayer DCA Hb A1c Waived - CMP14+EGFR - CBC with Differential/Platelet - Lipid panel - TSH  6. Atherosclerosis of coronary artery bypass graft with angina pectoris, unspecified whether native or  transplanted heart (HCC) - CMP14+EGFR - CBC with Differential/Platelet  7. Current smoker - CMP14+EGFR - CBC with Differential/Platelet  8. Chronic bronchitis, unspecified chronic bronchitis type ([place]) - CMP14+EGFR - CBC with Differential/Platelet  9. Need for pneumococcal 20-valent conjugate vaccination - Pneumococcal conjugate vaccine 20-valent (Prevnar 20)   Labs pending Health Maintenance reviewed Diet and exercise encouraged  Follow up plan: 6 months    Evelina Dun, FNP

## 2023-01-23 NOTE — Patient Instructions (Signed)
Health Maintenance After Age 72 After age 72, you are at a higher risk for certain long-term diseases and infections as well as injuries from falls. Falls are a major cause of broken bones and head injuries in people who are older than age 72. Getting regular preventive care can help to keep you healthy and well. Preventive care includes getting regular testing and making lifestyle changes as recommended by your health care provider. Talk with your health care provider about: Which screenings and tests you should have. A screening is a test that checks for a disease when you have no symptoms. A diet and exercise plan that is right for you. What should I know about screenings and tests to prevent falls? Screening and testing are the best ways to find a health problem early. Early diagnosis and treatment give you the best chance of managing medical conditions that are common after age 72. Certain conditions and lifestyle choices may make you more likely to have a fall. Your health care provider may recommend: Regular vision checks. Poor vision and conditions such as cataracts can make you more likely to have a fall. If you wear glasses, make sure to get your prescription updated if your vision changes. Medicine review. Work with your health care provider to regularly review all of the medicines you are taking, including over-the-counter medicines. Ask your health care provider about any side effects that may make you more likely to have a fall. Tell your health care provider if any medicines that you take make you feel dizzy or sleepy. Strength and balance checks. Your health care provider may recommend certain tests to check your strength and balance while standing, walking, or changing positions. Foot health exam. Foot pain and numbness, as well as not wearing proper footwear, can make you more likely to have a fall. Screenings, including: Osteoporosis screening. Osteoporosis is a condition that causes  the bones to get weaker and break more easily. Blood pressure screening. Blood pressure changes and medicines to control blood pressure can make you feel dizzy. Depression screening. You may be more likely to have a fall if you have a fear of falling, feel depressed, or feel unable to do activities that you used to do. Alcohol use screening. Using too much alcohol can affect your balance and may make you more likely to have a fall. Follow these instructions at home: Lifestyle Do not drink alcohol if: Your health care provider tells you not to drink. If you drink alcohol: Limit how much you have to: 0-1 drink a day for women. 0-2 drinks a day for men. Know how much alcohol is in your drink. In the [place] one drink equals one 12 oz bottle of beer (355 mL), one 5 oz glass of wine (148 mL), or one 1 oz glass of hard liquor (44 mL). Do not use any products that contain nicotine or tobacco. These products include cigarettes, chewing tobacco, and vaping devices, such as e-cigarettes. If you need help quitting, ask your health care provider. Activity  Follow a regular exercise program to stay fit. This will help you maintain your balance. Ask your health care provider what types of exercise are appropriate for you. If you need a cane or walker, use it as recommended by your health care provider. Wear supportive shoes that have nonskid soles. Safety  Remove any tripping hazards, such as rugs, cords, and clutter. Install safety equipment such as grab bars in bathrooms and safety rails on stairs. Keep rooms and walkways   well-lit. General instructions Talk with your health care provider about your risks for falling. Tell your health care provider if: You fall. Be sure to tell your health care provider about all falls, even ones that seem minor. You feel dizzy, tiredness (fatigue), or off-balance. Take over-the-counter and prescription medicines only as told by your health care provider. These include  supplements. Eat a healthy diet and maintain a healthy weight. A healthy diet includes low-fat dairy products, low-fat (lean) meats, and fiber from whole grains, beans, and lots of fruits and vegetables. Stay current with your vaccines. Schedule regular health, dental, and eye exams. Summary Having a healthy lifestyle and getting preventive care can help to protect your health and wellness after age 72. Screening and testing are the best way to find a health problem early and help you avoid having a fall. Early diagnosis and treatment give you the best chance for managing medical conditions that are more common for people who are older than age 72. Falls are a major cause of broken bones and head injuries in people who are older than age 72. Take precautions to prevent a fall at home. Work with your health care provider to learn what changes you can make to improve your health and wellness and to prevent falls. This information is not intended to replace advice given to you by your health care provider. Make sure you discuss any questions you have with your health care provider. Document Revised: 04/23/2021 Document Reviewed: 04/23/2021 Elsevier Patient Education  2023 Elsevier Inc.  

## 2023-01-24 ENCOUNTER — Other Ambulatory Visit: Payer: Self-pay | Admitting: Family

## 2023-01-24 LAB — CMP14+EGFR
ALT: 12 IU/L (ref 0–32)
AST: 13 IU/L (ref 0–40)
Albumin/Globulin Ratio: 2.2 (ref 1.2–2.2)
Albumin: 4.4 g/dL (ref 3.8–4.8)
Alkaline Phosphatase: 67 IU/L (ref 44–121)
BUN/Creatinine Ratio: 17 (ref 12–28)
BUN: 13 mg/dL (ref 8–27)
Bilirubin Total: 0.3 mg/dL (ref 0.0–1.2)
CO2: 24 mmol/L (ref 20–29)
Calcium: 9.4 mg/dL (ref 8.7–10.3)
Chloride: 103 mmol/L (ref 96–106)
Creatinine, Ser: 0.77 mg/dL (ref 0.57–1.00)
Globulin, Total: 2 g/dL (ref 1.5–4.5)
Glucose: 162 mg/dL — ABNORMAL HIGH (ref 70–99)
Potassium: 4.5 mmol/L (ref 3.5–5.2)
Sodium: 140 mmol/L (ref 134–144)
Total Protein: 6.4 g/dL (ref 6.0–8.5)
eGFR: 82 mL/min/{1.73_m2} (ref >59)

## 2023-01-24 LAB — CBC WITH DIFFERENTIAL/PLATELET
Basophils Absolute: 0 10*3/uL (ref 0.0–0.2)
Basos: 1 %
EOS (ABSOLUTE): 0.1 10*3/uL (ref 0.0–0.4)
Eos: 2 %
Hematocrit: 39.4 % (ref 34.0–46.6)
Hemoglobin: 12.7 g/dL (ref 11.1–15.9)
Immature Grans (Abs): 0 10*3/uL (ref 0.0–0.1)
Immature Granulocytes: 0 %
Lymphocytes Absolute: 2.2 10*3/uL (ref 0.7–3.1)
Lymphs: 36 %
MCH: 30.5 pg (ref 26.6–33.0)
MCHC: 32.2 g/dL (ref 31.5–35.7)
MCV: 95 fL (ref 79–97)
Monocytes Absolute: 0.4 10*3/uL (ref 0.1–0.9)
Monocytes: 6 %
Neutrophils Absolute: 3.4 10*3/uL (ref 1.4–7.0)
Neutrophils: 55 %
Platelets: 206 10*3/uL (ref 150–450)
RBC: 4.17 x10E6/uL (ref 3.77–5.28)
RDW: 12.8 % (ref 11.7–15.4)
WBC: 6.1 10*3/uL (ref 3.4–10.8)

## 2023-01-24 LAB — LIPID PANEL
Chol/HDL Ratio: 3.4 ratio (ref 0.0–4.4)
Cholesterol, Total: 147 mg/dL (ref 100–199)
HDL: 43 mg/dL (ref >39)
LDL Chol Calc (NIH): 86 mg/dL (ref 0–99)
Triglycerides: 99 mg/dL (ref 0–149)
VLDL Cholesterol Cal: 18 mg/dL (ref 5–40)

## 2023-01-24 LAB — TSH: TSH: 1.37 u[IU]/mL (ref 0.450–4.500)

## 2023-01-24 MED ORDER — OZEMPIC (0.25 OR 0.5 MG/DOSE) 2 MG/1.5ML ~~LOC~~ SOPN: 0.5000 mg | PEN_INJECTOR | SUBCUTANEOUS | 0 refills | Status: DC

## 2023-01-29 ENCOUNTER — Encounter: Payer: Self-pay | Admitting: Family Medicine

## 2023-02-07 ENCOUNTER — Telehealth: Payer: Self-pay | Admitting: Family

## 2023-02-07 NOTE — Telephone Encounter (Signed)
Called patient to schedule Medicare Annual Wellness Visit (AWV). Left message for patient to call back and schedule Medicare Annual Wellness Visit (AWV).  Last date of AWV: 02/01/2022   Please schedule an appointment at any time with either Mickel Baas or [place], [facility]. .  If any questions, please contact me at [phone].  .[email or web address]

## 2023-02-19 ENCOUNTER — Other Ambulatory Visit: Payer: Self-pay | Admitting: Family

## 2023-02-19 ENCOUNTER — Other Ambulatory Visit: Payer: Self-pay | Admitting: Family Medicine

## 2023-02-19 DIAGNOSIS — Z951 Presence of aortocoronary bypass graft: Secondary | ICD-10-CM

## 2023-02-19 MED ORDER — OZEMPIC (0.25 OR 0.5 MG/DOSE) 2 MG/1.5ML ~~LOC~~ SOPN: 0.5000 mg | PEN_INJECTOR | SUBCUTANEOUS | 0 refills | Status: DC

## 2023-03-18 ENCOUNTER — Other Ambulatory Visit: Payer: Self-pay | Admitting: Family

## 2023-03-20 ENCOUNTER — Telehealth: Payer: Self-pay | Admitting: Family

## 2023-03-20 NOTE — Telephone Encounter (Signed)
Called patient to schedule Medicare Annual Wellness Visit (AWV). Left message for patient to call back and schedule Medicare Annual Wellness Visit (AWV).  Last date of AWV: 02/01/2022   Please schedule an appointment at any time with either Mickel Baas or [place], [facility]. .  If any questions, please contact me at [phone].  Thank you,  Colletta Maryland,  [facility] Direct Dial ??[phone]

## 2023-04-16 ENCOUNTER — Telehealth: Payer: Self-pay | Admitting: Family

## 2023-04-16 NOTE — Telephone Encounter (Signed)
Called patient to schedule Medicare Annual Wellness Visit (AWV). Left message for patient to call back and schedule Medicare Annual Wellness Visit (AWV).  Last date of AWV: : 02/01/2022   Please schedule an appointment at any time with Vernona Rieger, [facility]. .  If any questions, please contact me at [phone].  Thank you,  Judeth Cornfield,  AMB Clinical Support [facility] AWV Program Direct Dial ??[phone]

## 2023-05-30 DIAGNOSIS — I10 Essential (primary) hypertension: Secondary | ICD-10-CM | POA: Diagnosis not present

## 2023-05-30 DIAGNOSIS — I2510 Atherosclerotic heart disease of native coronary artery without angina pectoris: Secondary | ICD-10-CM | POA: Diagnosis not present

## 2023-05-30 DIAGNOSIS — E782 Mixed hyperlipidemia: Secondary | ICD-10-CM | POA: Diagnosis not present

## 2023-06-09 ENCOUNTER — Other Ambulatory Visit: Payer: Self-pay | Admitting: Family

## 2023-06-09 ENCOUNTER — Encounter: Payer: Self-pay | Admitting: Family

## 2023-06-09 NOTE — Telephone Encounter (Signed)
LMTCB to schedule appt Letter mailed 

## 2023-06-09 NOTE — Telephone Encounter (Signed)
[place] NTBS in Aug for 6 mos FU RF sent to pharmacy 

## 2023-07-07 LAB — HM DIABETES EYE EXAM

## 2023-07-18 ENCOUNTER — Other Ambulatory Visit: Payer: Self-pay | Admitting: Family

## 2023-07-18 DIAGNOSIS — E1169 Type 2 diabetes mellitus with other specified complication: Secondary | ICD-10-CM

## 2023-07-21 ENCOUNTER — Other Ambulatory Visit: Payer: Self-pay | Admitting: Family

## 2023-07-21 DIAGNOSIS — I152 Hypertension secondary to endocrine disorders: Secondary | ICD-10-CM

## 2023-07-23 ENCOUNTER — Other Ambulatory Visit: Payer: Self-pay | Admitting: Family

## 2023-07-23 DIAGNOSIS — I152 Hypertension secondary to endocrine disorders: Secondary | ICD-10-CM

## 2023-07-25 ENCOUNTER — Telehealth: Payer: Self-pay | Admitting: Family

## 2023-07-25 ENCOUNTER — Other Ambulatory Visit: Payer: Self-pay | Admitting: Family

## 2023-07-25 DIAGNOSIS — E1159 Type 2 diabetes mellitus with other circulatory complications: Secondary | ICD-10-CM

## 2023-07-25 DIAGNOSIS — E1169 Type 2 diabetes mellitus with other specified complication: Secondary | ICD-10-CM

## 2023-07-25 NOTE — Telephone Encounter (Signed)
LMOVM 30 day refills sent to pharmacy, please call back to make 6 mos FU Refills sent in from electronic request.

## 2023-07-25 NOTE — Telephone Encounter (Signed)
  Prescription Request  07/25/2023  Is this a "Controlled Substance" medicine? OZEMPIC, 0.25 OR 0.5 MG/DOSE, 2 MG/3ML SOPN   Have you seen your PCP in the last 2 weeks? no  If YES, route message to pool  -  If NO, patient needs to be scheduled for appointment.  What is the name of the medication or equipment? OZEMPIC, 0.25 OR 0.5 MG/DOSE, 2 MG/3ML SOPN   Have you contacted your pharmacy to request a refill? Yes pt was told to call office   Which pharmacy would you like this sent to? [facility].   Patient notified that their request is being sent to the clinical staff for review and that they should receive a response within 2 business days.

## 2023-08-27 ENCOUNTER — Telehealth: Payer: Self-pay | Admitting: Family

## 2023-08-27 NOTE — Telephone Encounter (Signed)
Pt called requesting to speak with nurse regarding her appt with PCP on 9/17. She says she is going to be out of town and wants to speak with the nurse to see if she can work pt in to be seen sooner.Jamal Maes explaining to pt that [name] doesn't have anymore openings before 9/17 but pt wants to speak with nurse.

## 2023-08-28 NOTE — Telephone Encounter (Signed)
Lmtcb.

## 2023-09-02 ENCOUNTER — Ambulatory Visit: Payer: 59 | Admitting: Family

## 2023-09-03 ENCOUNTER — Encounter: Payer: Self-pay | Admitting: Family

## 2023-09-14 ENCOUNTER — Other Ambulatory Visit: Payer: Self-pay | Admitting: Family

## 2023-09-14 DIAGNOSIS — I152 Hypertension secondary to endocrine disorders: Secondary | ICD-10-CM

## 2023-09-16 ENCOUNTER — Other Ambulatory Visit: Payer: Self-pay | Admitting: Family

## 2023-09-18 ENCOUNTER — Other Ambulatory Visit: Payer: Self-pay | Admitting: Family

## 2023-09-18 DIAGNOSIS — E1169 Type 2 diabetes mellitus with other specified complication: Secondary | ICD-10-CM

## 2023-09-22 ENCOUNTER — Encounter: Payer: Self-pay | Admitting: Family

## 2023-09-22 ENCOUNTER — Ambulatory Visit: Payer: 59 | Admitting: Family

## 2023-09-22 ENCOUNTER — Other Ambulatory Visit (INDEPENDENT_AMBULATORY_CARE_PROVIDER_SITE_OTHER): Payer: 59

## 2023-09-22 VITALS — BP 125/65 | HR 74 | Temp 97.7°F | Ht 63.0 in | Wt 139.4 lb

## 2023-09-22 DIAGNOSIS — I152 Hypertension secondary to endocrine disorders: Secondary | ICD-10-CM

## 2023-09-22 DIAGNOSIS — F17200 Nicotine dependence, unspecified, uncomplicated: Secondary | ICD-10-CM | POA: Diagnosis not present

## 2023-09-22 DIAGNOSIS — E1169 Type 2 diabetes mellitus with other specified complication: Secondary | ICD-10-CM | POA: Diagnosis not present

## 2023-09-22 DIAGNOSIS — E1159 Type 2 diabetes mellitus with other circulatory complications: Secondary | ICD-10-CM | POA: Diagnosis not present

## 2023-09-22 DIAGNOSIS — J42 Unspecified chronic bronchitis: Secondary | ICD-10-CM

## 2023-09-22 DIAGNOSIS — I25709 Atherosclerosis of coronary artery bypass graft(s), unspecified, with unspecified angina pectoris: Secondary | ICD-10-CM | POA: Diagnosis not present

## 2023-09-22 DIAGNOSIS — Z1211 Encounter for screening for malignant neoplasm of colon: Secondary | ICD-10-CM

## 2023-09-22 DIAGNOSIS — Z951 Presence of aortocoronary bypass graft: Secondary | ICD-10-CM

## 2023-09-22 DIAGNOSIS — E785 Hyperlipidemia, unspecified: Secondary | ICD-10-CM

## 2023-09-22 DIAGNOSIS — Z780 Asymptomatic menopausal state: Secondary | ICD-10-CM

## 2023-09-22 LAB — BAYER DCA HB A1C WAIVED: HB A1C (BAYER DCA - WAIVED): 6 % — ABNORMAL HIGH (ref 4.8–5.6)

## 2023-09-22 MED ORDER — ROSUVASTATIN CALCIUM 10 MG PO TABS: 10.0000 mg | ORAL_TABLET | Freq: Every day | ORAL | 0 refills | Status: DC

## 2023-09-22 MED ORDER — ISOSORBIDE MONONITRATE ER 30 MG PO TB24
30.0000 mg | ORAL_TABLET | Freq: Every day | ORAL | 0 refills | Status: DC
Start: 2023-09-22 — End: 2024-03-15

## 2023-09-22 MED ORDER — OZEMPIC (0.25 OR 0.5 MG/DOSE) 2 MG/3ML ~~LOC~~ SOPN
PEN_INJECTOR | SUBCUTANEOUS | 0 refills | Status: DC
Start: 2023-09-22 — End: 2024-03-03

## 2023-09-22 MED ORDER — METOPROLOL SUCCINATE ER 25 MG PO TB24
ORAL_TABLET | ORAL | 0 refills | Status: DC
Start: 2023-09-22 — End: 2024-03-10

## 2023-09-22 MED ORDER — LISINOPRIL 10 MG PO TABS: 10.0000 mg | ORAL_TABLET | Freq: Every day | ORAL | 0 refills | Status: DC

## 2023-09-22 MED ORDER — METFORMIN HCL ER 750 MG PO TB24
ORAL_TABLET | ORAL | 0 refills | Status: DC
Start: 2023-09-22 — End: 2024-03-15

## 2023-09-22 MED ORDER — EMPAGLIFLOZIN 25 MG PO TABS
25.0000 mg | ORAL_TABLET | Freq: Every day | ORAL | 0 refills | Status: DC
Start: 2023-09-22 — End: 2023-11-12

## 2023-09-22 NOTE — Patient Instructions (Signed)
Health Maintenance After Age 72 After age 72, you are at a higher risk for certain long-term diseases and infections as well as injuries from falls. Falls are a major cause of broken bones and head injuries in people who are older than age 72. Getting regular preventive care can help to keep you healthy and well. Preventive care includes getting regular testing and making lifestyle changes as recommended by your health care provider. Talk with your health care provider about: Which screenings and tests you should have. A screening is a test that checks for a disease when you have no symptoms. A diet and exercise plan that is right for you. What should I know about screenings and tests to prevent falls? Screening and testing are the best ways to find a health problem early. Early diagnosis and treatment give you the best chance of managing medical conditions that are common after age 72. Certain conditions and lifestyle choices may make you more likely to have a fall. Your health care provider may recommend: Regular vision checks. Poor vision and conditions such as cataracts can make you more likely to have a fall. If you wear glasses, make sure to get your prescription updated if your vision changes. Medicine review. Work with your health care provider to regularly review all of the medicines you are taking, including over-the-counter medicines. Ask your health care provider about any side effects that may make you more likely to have a fall. Tell your health care provider if any medicines that you take make you feel dizzy or sleepy. Strength and balance checks. Your health care provider may recommend certain tests to check your strength and balance while standing, walking, or changing positions. Foot health exam. Foot pain and numbness, as well as not wearing proper footwear, can make you more likely to have a fall. Screenings, including: Osteoporosis screening. Osteoporosis is a condition that causes  the bones to get weaker and break more easily. Blood pressure screening. Blood pressure changes and medicines to control blood pressure can make you feel dizzy. Depression screening. You may be more likely to have a fall if you have a fear of falling, feel depressed, or feel unable to do activities that you used to do. Alcohol use screening. Using too much alcohol can affect your balance and may make you more likely to have a fall. Follow these instructions at home: Lifestyle Do not drink alcohol if: Your health care provider tells you not to drink. If you drink alcohol: Limit how much you have to: 0-1 drink a day for women. 0-2 drinks a day for men. Know how much alcohol is in your drink. In the [place] one drink equals one 12 oz bottle of beer (355 mL), one 5 oz glass of wine (148 mL), or one 1 oz glass of hard liquor (44 mL). Do not use any products that contain nicotine or tobacco. These products include cigarettes, chewing tobacco, and vaping devices, such as e-cigarettes. If you need help quitting, ask your health care provider. Activity  Follow a regular exercise program to stay fit. This will help you maintain your balance. Ask your health care provider what types of exercise are appropriate for you. If you need a cane or walker, use it as recommended by your health care provider. Wear supportive shoes that have nonskid soles. Safety  Remove any tripping hazards, such as rugs, cords, and clutter. Install safety equipment such as grab bars in bathrooms and safety rails on stairs. Keep rooms and walkways   well-lit. General instructions Talk with your health care provider about your risks for falling. Tell your health care provider if: You fall. Be sure to tell your health care provider about all falls, even ones that seem minor. You feel dizzy, tiredness (fatigue), or off-balance. Take over-the-counter and prescription medicines only as told by your health care provider. These include  supplements. Eat a healthy diet and maintain a healthy weight. A healthy diet includes low-fat dairy products, low-fat (lean) meats, and fiber from whole grains, beans, and lots of fruits and vegetables. Stay current with your vaccines. Schedule regular health, dental, and eye exams. Summary Having a healthy lifestyle and getting preventive care can help to protect your health and wellness after age 72. Screening and testing are the best way to find a health problem early and help you avoid having a fall. Early diagnosis and treatment give you the best chance for managing medical conditions that are more common for people who are older than age 72. Falls are a major cause of broken bones and head injuries in people who are older than age 72. Take precautions to prevent a fall at home. Work with your health care provider to learn what changes you can make to improve your health and wellness and to prevent falls. This information is not intended to replace advice given to you by your health care provider. Make sure you discuss any questions you have with your health care provider. Document Revised: 04/23/2021 Document Reviewed: 04/23/2021 Elsevier Patient Education  2024 Elsevier Inc.  

## 2023-09-22 NOTE — Progress Notes (Signed)
Subjective:    Patient ID: Heidi Graham, female    DOB: Jul 28, 1951, 72 y.o.   MRN: [id number]  Chief Complaint  Patient presents with   Medical Management of Chronic Issues   Pt presents to the office today for chronic follow up.  PT had CABG in 2011 and 2017 is followed by Cardiologists annually.    She has atherosclerosis and takes Crestor daily.    She has COPD and continues to smoke 1/3 pack a day. She has intermittent SOB.  Hypertension This is a chronic problem. The current episode started more than 1 year ago. The problem has been waxing and waning since onset. The problem is uncontrolled. Pertinent negatives include no blurred vision, malaise/fatigue, peripheral edema or shortness of breath. Risk factors for coronary artery disease include dyslipidemia, diabetes mellitus and sedentary lifestyle. The current treatment provides moderate improvement.  Diabetes She presents for her follow-up diabetic visit. She has type 2 diabetes mellitus. Pertinent negatives for diabetes include no blurred vision and no foot paresthesias. Risk factors for coronary artery disease include dyslipidemia, diabetes mellitus, hypertension, sedentary lifestyle and post-menopausal. She is following a generally healthy diet. Her overall blood glucose range is 110-130 mg/dl.  Hyperlipidemia This is a chronic problem. The current episode started more than 1 year ago. The problem is controlled. Recent lipid tests were reviewed and are normal. Pertinent negatives include no shortness of breath. Current antihyperlipidemic treatment includes statins. The current treatment provides moderate improvement of lipids. Risk factors for coronary artery disease include dyslipidemia, diabetes mellitus, hypertension, a sedentary lifestyle and post-menopausal.  Nicotine Dependence Presents for follow-up visit. Her urge triggers include company of smokers. The symptoms have been stable. She smokes < 1/2 a pack of cigarettes per day.       Review of Systems  Constitutional:  Negative for malaise/fatigue.  Eyes:  Negative for blurred vision.  Respiratory:  Negative for shortness of breath.   All other systems reviewed and are negative.      Objective:   Physical Exam Vitals reviewed.  Constitutional:      General: She is not in acute distress.    Appearance: She is well-developed.  HENT:     Head: Normocephalic and atraumatic.     Right Ear: Tympanic membrane normal.     Left Ear: Tympanic membrane normal.  Eyes:     Pupils: Pupils are equal, round, and reactive to light.  Neck:     Thyroid: No thyromegaly.  Cardiovascular:     Rate and Rhythm: Normal rate and regular rhythm.     Heart sounds: Normal heart sounds. No murmur heard. Pulmonary:     Effort: Pulmonary effort is normal. No respiratory distress.     Breath sounds: Normal breath sounds. No wheezing.  Abdominal:     General: Bowel sounds are normal. There is no distension.     Palpations: Abdomen is soft.     Tenderness: There is no abdominal tenderness.  Musculoskeletal:        General: No tenderness. Normal range of motion.     Cervical back: Normal range of motion and neck supple.  Skin:    General: Skin is warm and dry.  Neurological:     Mental Status: She is alert and oriented to person, place, and time.     Cranial Nerves: No cranial nerve deficit.     Deep Tendon Reflexes: Reflexes are normal and symmetric.  Psychiatric:        Behavior: Behavior normal.  Thought Content: Thought content normal.        Judgment: Judgment normal.    BP (!) 147/70   Pulse 74   Temp 97.7 F (36.5 C) (Temporal)   Ht 5\' 3"  (1.6 m)   Wt 139 lb 6.4 oz (63.2 kg)   SpO2 96%   BMI 24.69 kg/m      Assessment & Plan:  Heidi Graham comes in today with chief complaint of Medical Management of Chronic Issues   Diagnosis and orders addressed:  1. Type 2 diabetes mellitus with other specified complication, without long-term current use of  insulin (HCC) Low carb diet  - empagliflozin (JARDIANCE) 25 MG TABS tablet; Take 1 tablet (25 mg total) by mouth daily.  Dispense: 30 tablet; Refill: 0 - metFORMIN (GLUCOPHAGE-XR) 750 MG 24 hr tablet; TAKE 2 TABLETS (1,500 MG TOTAL) BY MOUTH EVERY DAY WITH BREAKFAST  Dispense: 180 tablet; Refill: 0 - Semaglutide,0.25 or 0.5MG /DOS, (OZEMPIC, 0.25 OR 0.5 MG/DOSE,) 2 MG/3ML SOPN; INJECT 0.5 MG INTO THE SKIN ONE TIME PER WEEK  Dispense: 9 mL; Refill: 0 - Bayer DCA Hb A1c Waived - Microalbumin / creatinine urine ratio - CMP14+EGFR  2. Hx of CABG - isosorbide mononitrate (IMDUR) 30 MG 24 hr tablet; Take 1 tablet (30 mg total) by mouth daily.  Dispense: 90 tablet; Refill: 0 - CMP14+EGFR  3. Hypertension associated with diabetes (HCC) - metoprolol succinate (TOPROL-XL) 25 MG 24 hr tablet; TAKE 1 TABLET BY MOUTH EVERY DAY  Dispense: 90 tablet; Refill: 0 - CMP14+EGFR  4. Hyperlipidemia associated with type 2 diabetes mellitus (HCC) - rosuvastatin (CRESTOR) 10 MG tablet; Take 1 tablet (10 mg total) by mouth daily. **NEEDS TO BE SEEN BEFORE NEXT REFILL**  Dispense: 30 tablet; Refill: 0 - CMP14+EGFR  5. Atherosclerosis of coronary artery bypass graft with angina pectoris, unspecified whether native or transplanted heart (HCC) - CMP14+EGFR  6. Chronic bronchitis, unspecified chronic bronchitis type (HCC) - CMP14+EGFR  7. Current smoker - CMP14+EGFR  8. Post-menopause - DG [facility] DEXA  9. Colon cancer screening - Cologuard   Labs pending Continue current medications  Health Maintenance reviewed Diet and exercise encouraged  Follow up plan: 3 months    Jannifer Rodney, FNP

## 2023-09-23 LAB — CMP14+EGFR
ALT: 12 [IU]/L (ref 0–32)
AST: 14 [IU]/L (ref 0–40)
Albumin: 4.5 g/dL (ref 3.8–4.8)
Alkaline Phosphatase: 75 [IU]/L (ref 44–121)
BUN/Creatinine Ratio: 16 (ref 12–28)
BUN: 10 mg/dL (ref 8–27)
Bilirubin Total: 0.4 mg/dL (ref 0.0–1.2)
CO2: 25 mmol/L (ref 20–29)
Calcium: 10 mg/dL (ref 8.7–10.3)
Chloride: 104 mmol/L (ref 96–106)
Creatinine, Ser: 0.61 mg/dL (ref 0.57–1.00)
Globulin, Total: 2.1 g/dL (ref 1.5–4.5)
Glucose: 103 mg/dL — ABNORMAL HIGH (ref 70–99)
Potassium: 5 mmol/L (ref 3.5–5.2)
Sodium: 141 mmol/L (ref 134–144)
Total Protein: 6.6 g/dL (ref 6.0–8.5)
eGFR: 95 mL/min/{1.73_m2} (ref >59)

## 2023-09-23 LAB — MICROALBUMIN / CREATININE URINE RATIO
Creatinine, Urine: 16.6 mg/dL
Microalb/Creat Ratio: 77 mg/g{creat} — ABNORMAL HIGH (ref 0–29)
Microalbumin, Urine: 12.8 ug/mL

## 2023-10-01 ENCOUNTER — Other Ambulatory Visit: Payer: Self-pay | Admitting: Family

## 2023-10-01 DIAGNOSIS — Z1231 Encounter for screening mammogram for malignant neoplasm of breast: Secondary | ICD-10-CM

## 2023-11-05 ENCOUNTER — Other Ambulatory Visit: Payer: Self-pay | Admitting: Family

## 2023-11-05 DIAGNOSIS — E1169 Type 2 diabetes mellitus with other specified complication: Secondary | ICD-10-CM

## 2023-11-12 ENCOUNTER — Other Ambulatory Visit: Payer: Self-pay | Admitting: Family

## 2023-11-12 DIAGNOSIS — E1169 Type 2 diabetes mellitus with other specified complication: Secondary | ICD-10-CM

## 2023-12-01 ENCOUNTER — Other Ambulatory Visit: Payer: Self-pay | Admitting: Family

## 2023-12-01 DIAGNOSIS — E1169 Type 2 diabetes mellitus with other specified complication: Secondary | ICD-10-CM

## 2023-12-14 DIAGNOSIS — G4489 Other headache syndrome: Secondary | ICD-10-CM | POA: Diagnosis not present

## 2023-12-14 DIAGNOSIS — Z743 Need for continuous supervision: Secondary | ICD-10-CM | POA: Diagnosis not present

## 2023-12-14 DIAGNOSIS — R1032 Left lower quadrant pain: Secondary | ICD-10-CM | POA: Diagnosis not present

## 2023-12-14 DIAGNOSIS — Z885 Allergy status to narcotic agent status: Secondary | ICD-10-CM | POA: Diagnosis not present

## 2023-12-14 DIAGNOSIS — Z794 Long term (current) use of insulin: Secondary | ICD-10-CM | POA: Diagnosis not present

## 2023-12-14 DIAGNOSIS — K529 Noninfective gastroenteritis and colitis, unspecified: Secondary | ICD-10-CM | POA: Diagnosis not present

## 2023-12-14 DIAGNOSIS — I10 Essential (primary) hypertension: Secondary | ICD-10-CM | POA: Diagnosis not present

## 2023-12-14 DIAGNOSIS — Z79899 Other long term (current) drug therapy: Secondary | ICD-10-CM | POA: Diagnosis not present

## 2023-12-14 DIAGNOSIS — E119 Type 2 diabetes mellitus without complications: Secondary | ICD-10-CM | POA: Diagnosis not present

## 2023-12-14 DIAGNOSIS — R0689 Other abnormalities of breathing: Secondary | ICD-10-CM | POA: Diagnosis not present

## 2023-12-14 DIAGNOSIS — Z7984 Long term (current) use of oral hypoglycemic drugs: Secondary | ICD-10-CM | POA: Diagnosis not present

## 2023-12-14 DIAGNOSIS — I499 Cardiac arrhythmia, unspecified: Secondary | ICD-10-CM | POA: Diagnosis not present

## 2023-12-14 DIAGNOSIS — I2510 Atherosclerotic heart disease of native coronary artery without angina pectoris: Secondary | ICD-10-CM | POA: Diagnosis not present

## 2023-12-14 DIAGNOSIS — R109 Unspecified abdominal pain: Secondary | ICD-10-CM | POA: Diagnosis not present

## 2023-12-14 DIAGNOSIS — R531 Weakness: Secondary | ICD-10-CM | POA: Diagnosis not present

## 2023-12-14 DIAGNOSIS — Z7985 Long-term (current) use of injectable non-insulin antidiabetic drugs: Secondary | ICD-10-CM | POA: Diagnosis not present

## 2023-12-14 DIAGNOSIS — Z720 Tobacco use: Secondary | ICD-10-CM | POA: Diagnosis not present

## 2023-12-14 DIAGNOSIS — F17210 Nicotine dependence, cigarettes, uncomplicated: Secondary | ICD-10-CM | POA: Diagnosis not present

## 2023-12-14 DIAGNOSIS — R112 Nausea with vomiting, unspecified: Secondary | ICD-10-CM | POA: Diagnosis not present

## 2023-12-15 ENCOUNTER — Telehealth: Payer: Self-pay

## 2023-12-15 NOTE — Transitions of Care (Post Inpatient/ED Visit) (Signed)
12/15/2023  Name: Heidi Graham MRN: [id number] DOB: 1951/05/03  Today's TOC FU Call Status: Today's TOC FU Call Status:: Successful TOC FU Call Completed TOC FU Call Complete Date: 12/15/23 Patient's Name and Date of Birth confirmed.  Transition Care Management Follow-up Telephone Call Date of Discharge: 12/14/23 Discharge Facility: Other [facility]) Name of Other (Non[facility]) Discharge Facility: [facility] Type of Discharge: Emergency Department Reason for ED Visit: Other: (gastroenterities) How have you been since you were released from the hospital?: Same Any questions or concerns?: Yes  Items Reviewed: Did you receive and understand the discharge instructions provided?: Yes Medications obtained,verified, and reconciled?: Yes (Medications Reviewed) Any new allergies since your discharge?: No Dietary orders reviewed?: Yes Do you have support at home?: Yes People in Home: child(ren), adult  Medications Reviewed Today: Medications Reviewed Today     Reviewed by Karena Addison, LPN (Licensed Practical Nurse) on 12/15/23 at 1741  Med List Status: <None>   Medication Order Taking? Sig Documenting Provider Last Dose Status Informant  aspirin EC 81 MG tablet [id number] No Take 1 tablet (81 mg total) by mouth daily. Swallow whole. Junie Spencer, FNP Taking Active   Blood Glucose Monitoring Suppl (ONETOUCH VERIO FLEX SYSTEM) w/Device KIT [id number] No Use to test blood sugar two times daily as directed. DX:E11.9 Junie Spencer, FNP Taking Active   glucose blood (ONETOUCH VERIO) test strip [id number] No Use to test blood sugar two times daily as directed. DX:E11.9 Junie Spencer, FNP Taking Active   ibuprofen (ADVIL) 400 MG tablet [id number] No Take 400 mg by mouth every 6 (six) hours as needed. [provider] Taking Active   isosorbide mononitrate (IMDUR) 30 MG 24 hr tablet [id number]  Take 1 tablet (30 mg total) by mouth daily. Jannifer Rodney A, FNP  Active    JARDIANCE 25 MG TABS tablet [id number]  TAKE 1 TABLET (25 MG TOTAL) BY MOUTH DAILY. Jannifer Rodney A, FNP  Active   lisinopril (ZESTRIL) 10 MG tablet [id number]  Take 1 tablet (10 mg total) by mouth daily. Jannifer Rodney A, FNP  Active   metFORMIN (GLUCOPHAGE-XR) 750 MG 24 hr tablet [id number]  TAKE 2 TABLETS (1,500 MG TOTAL) BY MOUTH EVERY DAY WITH BREAKFAST Hawks, [place] A, FNP  Active   metoprolol succinate (TOPROL-XL) 25 MG 24 hr tablet [id number]  TAKE 1 TABLET BY MOUTH EVERY DAY Hawks, Edilia Bo, FNP  Active   Multiple Vitamin (THERA) TABS [id number] No Take by mouth. [provider] Taking Active   nitroGLYCERIN (NITROSTAT) 0.4 MG SL tablet [id number] No Place 0.4 mg under the tongue every 5 (five) minutes as needed for chest pain. [provider] Taking Active   OneTouch Delica Lancets 33G MISC [id number] No Use to test blood sugar two times daily as directed. DX:E11.9 Junie Spencer, FNP Taking Active   rosuvastatin (CRESTOR) 10 MG tablet [id number]  Take 1 tablet (10 mg total) by mouth daily. Jannifer Rodney A, FNP  Active   Semaglutide,0.25 or 0.5MG /DOS, (OZEMPIC, 0.25 OR 0.5 MG/DOSE,) 2 MG/3ML SOPN [id number]  INJECT 0.5 MG INTO THE SKIN ONE TIME PER WEEK Junie Spencer, FNP  Active             Home Care and Equipment/Supplies: Were Home Health Services Ordered?: NA Any new equipment or medical supplies ordered?: NA  Functional Questionnaire: Do you need assistance with bathing/showering or dressing?: No Do you need assistance with meal preparation?: No Do you need assistance with eating?: No Do you have  difficulty maintaining continence: No Do you need assistance with getting out of bed/getting out of a chair/moving?: No Do you have difficulty managing or taking your medications?: No  Follow up appointments reviewed: PCP Follow-up appointment confirmed?: Yes Date of PCP follow-up appointment?: 12/24/23 Follow-up Provider: [facility] Follow-up  appointment confirmed?: NA Do you need transportation to your follow-up appointment?: No Do you understand care options if your condition(s) worsen?: Yes-patient verbalized understanding    SIGNATURE Karena Addison, LPN [facility] Nurse Health Advisor Direct Dial [phone]

## 2023-12-18 NOTE — Progress Notes (Signed)
 Established Patient Office Visit  Subjective  Patient ID: Heidi Graham, female    DOB: [birth year]  Age: 73 y.o. MRN: [id number]  Chief Complaint  Patient presents with   Hospitalization Follow-up    Doing better, pain only when standing for too long and at night when laying down    HPI Heidi Graham 73 year old female present December 24, 2023 for post hospital discharge visit. She was d/c on 12/14/23 and reports that she is feeling better. Abdominal pain: had Ct scan done showing Airspace consolidation in the lung bases (most severe in the right lower lobe), concerning for developing pneumonia Was d/c on Levaquin for 5 days which she completed the course off, and reports feeling. Appetite is back to normal, and sleeping okay. Denies chest pain, SOB, cough, syncope  Lipid/Cholesterol, Follow-up  Last lipid panel Other pertinent labs  Lab Results  Component Value Date   CHOL 147 01/23/2023   HDL 43 01/23/2023   LDLCALC 86 01/23/2023   TRIG 99 01/23/2023   CHOLHDL 3.4 01/23/2023   Lab Results  Component Value Date   ALT 12 09/22/2023   AST 14 09/22/2023   PLT 206 01/23/2023   TSH 1.370 01/23/2023     She was last seen for this 3 months ago.  Management since that visit includes Crestor  10 mg daily. She reports excellent compliance with treatment. She is not having side effects.   Symptoms: No chest pain No chest pressure/discomfort  No dyspnea No lower extremity edema  No numbness or tingling of extremity No orthopnea  No palpitations No paroxysmal nocturnal dyspnea  No speech difficulty No syncope   Current diet: in general, a healthy diet   Current exercise: none  The 10-year ASCVD risk score (Arnett DK, et al., 2019) is: 27.6%  Smokes 1 pack every 3-days  Relevant past medical, surgical, family, and social history reviewed and updated as indicated.  Allergies and medications reviewed and updated. Patient Active Problem List   Diagnosis Date Noted    Gastroenteritis 12/24/2023   Hospital discharge follow-up 12/24/2023   Pneumonia of both lower lobes due to infectious organism 12/24/2023   Left lower quadrant abdominal pain 12/24/2023   Atherosclerosis of coronary artery bypass graft with angina pectoris (HCC) 11/14/2020   Chronic bronchitis (HCC) 11/14/2020   Current smoker 01/20/2017   Hx of CABG 01/20/2017   Hyperlipidemia 09/19/2015   Diabetes (HCC) 06/29/2014   Hyperlipidemia associated with type 2 diabetes mellitus (HCC) 06/29/2014   Hypertension associated with diabetes (HCC) 06/29/2014   Past Medical History:  Diagnosis Date   Arthritis    Cataract    Diabetes mellitus without complication (HCC)    Hyperlipidemia    Hypertension    Myocardial infarction (HCC) 2011   Past Surgical History:  Procedure Laterality Date   CARDIAC SURGERY     Triple  Bypass   CESAREAN SECTION     two   CORONARY ARTERY BYPASS GRAFT     HERNIA REPAIR     umbilical   Social History   Tobacco Use   Smoking status: Former    Current packs/day: 0.00    Average packs/day: 0.3 packs/day for 28.0 years (8.4 ttl pk-yrs)    Types: Cigarettes    Start date: 01/16/1994    Quit date: 01/16/2022    Years since quitting: 1.9    Passive exposure: Past   Smokeless tobacco: Never   Tobacco comments:    smoked for 20 years - quit for 9 years  and restarted about 6 yeasr ago.    Quit again 01/16/2022  Vaping Use   Vaping status: Never Used  Substance Use Topics   Alcohol use: No   Drug use: No   Social History   Socioeconomic History   Marital status: Widowed    Spouse name: Not on file   Number of children: 3   Years of education: 39   Highest education level: 12th grade  Occupational History   Occupation: retired   Tobacco Use   Smoking status: Former    Current packs/day: 0.00    Average packs/day: 0.3 packs/day for 28.0 years (8.4 ttl pk-yrs)    Types: Cigarettes    Start date: 01/16/1994    Quit date: 01/16/2022    Years since  quitting: 1.9    Passive exposure: Past   Smokeless tobacco: Never   Tobacco comments:    smoked for 20 years - quit for 9 years and restarted about 6 yeasr ago.    Quit again 01/16/2022  Vaping Use   Vaping status: Never Used  Substance and Sexual Activity   Alcohol use: No   Drug use: No   Sexual activity: Not Currently    Partners: Male  Other Topics Concern   Not on file  Social History Narrative   Lives with daughter and 2 grandchildren    Son is truck hospital doctor and lives in [place]   Other son in [place], [place]   Social Drivers of Health   Financial Resource Strain: Low Risk  (10/02/2022)   Overall Financial Resource Strain (CARDIA)    Difficulty of Paying Living Expenses: Not hard at all  Food Insecurity: No Food Insecurity (10/02/2022)   Hunger Vital Sign    Worried About Running Out of Food in the Last Year: Never true    Ran Out of Food in the Last Year: Never true  Transportation Needs: No Transportation Needs (10/02/2022)   PRAPARE - Administrator, Civil Service (Medical): No    Lack of Transportation (Non-Medical): No  Physical Activity: Sufficiently Active (10/02/2022)   Exercise Vital Sign    Days of Exercise per Week: 7 days    Minutes of Exercise per Session: 30 min  Stress: No Stress Concern Present (10/02/2022)   [employer] of Occupational Health - Occupational Stress Questionnaire    Feeling of Stress : Not at all  Social Connections: Moderately Integrated (10/02/2022)   Social Connection and Isolation Panel [NHANES]    Frequency of Communication with Friends and Family: More than three times a week    Frequency of Social Gatherings with Friends and Family: More than three times a week    Attends Religious Services: More than 4 times per year    Active Member of [employer] or Organizations: Yes    Attends Banker Meetings: More than 4 times per year    Marital Status: Widowed  Intimate Partner Violence: Not At Risk  (10/02/2022)   Humiliation, Afraid, Rape, and Kick questionnaire    Fear of Current or Ex-Partner: No    Emotionally Abused: No    Physically Abused: No    Sexually Abused: No   Family Status  Relation Name Status   Mother  Deceased at age 32   Father  Deceased       doesn't know much of father's history   Sister  Alive   Sister  Deceased at age 108   Daughter  Alive   Daughter as baby Deceased  Brother  Alive   Brother  Deceased at age 87   Brother  Alive   Brother  Alive   Brother  Alive   Son  Alive   Son  Alive   Neg Hx  (Not Specified)  No partnership data on file   Family History  Problem Relation Age of Onset   Diabetes Mother    Hypertension Mother    Diabetes Sister        type 2   Hypertension Sister    Diabetes Daughter    Diabetes Brother        type 1   Diabetes Son    Breast cancer Neg Hx    Allergies  Allergen Reactions   Codeine Hives      Review of Systems  Constitutional:  Negative for chills and fever.  HENT:  Negative for congestion and sore throat.   Respiratory:  Negative for cough, shortness of breath and wheezing.   Cardiovascular:  Negative for chest pain and leg swelling.  Gastrointestinal:  Negative for constipation, diarrhea, nausea and vomiting.       Still dealing with minimal abdominal pain 2/10 dull pain  Genitourinary:  Negative for flank pain, frequency and urgency.  Musculoskeletal:  Negative for falls and myalgias.  Skin:  Negative for itching and rash.  Neurological:  Negative for dizziness and headaches.  Psychiatric/Behavioral:  Negative for suicidal ideas. The patient does not have insomnia.    Negative unless indicated in HPI   Objective:     BP 130/74   Pulse 73   Temp 98 F (36.7 C) (Temporal)   Ht 5' 3 (1.6 m)   Wt 144 lb 3.2 oz (65.4 kg)   SpO2 100%   BMI 25.54 kg/m  BP Readings from Last 3 Encounters:  12/24/23 130/74  09/22/23 125/65  01/23/23 134/62   Wt Readings from Last 3 Encounters:   12/24/23 144 lb 3.2 oz (65.4 kg)  09/22/23 139 lb 6.4 oz (63.2 kg)  01/23/23 156 lb 6.4 oz (70.9 kg)      Physical Exam Vitals and nursing note reviewed.  Constitutional:      Appearance: She is overweight.  HENT:     Head: Normocephalic and atraumatic.     Right Ear: Tympanic membrane, ear canal and external ear normal. There is no impacted cerumen.     Left Ear: Tympanic membrane, ear canal and external ear normal. There is no impacted cerumen.     Nose: Nose normal.     Mouth/Throat:     Mouth: Mucous membranes are moist.  Eyes:     General: No scleral icterus.    Extraocular Movements: Extraocular movements intact.     Conjunctiva/sclera: Conjunctivae normal.     Pupils: Pupils are equal, round, and reactive to light.  Cardiovascular:     Rate and Rhythm: Normal rate and regular rhythm.  Pulmonary:     Effort: Pulmonary effort is normal.     Breath sounds: Normal breath sounds. No wheezing.  Musculoskeletal:        General: Normal range of motion.     Right lower leg: No edema.     Left lower leg: No edema.  Skin:    General: Skin is warm and dry.     Findings: No rash.  Neurological:     Mental Status: She is alert and oriented to person, place, and time.  Psychiatric:        Mood and Affect: Mood normal.  Behavior: Behavior normal.        Thought Content: Thought content normal.        Judgment: Judgment normal.      No results found for any visits on 12/24/23.  Last CBC Lab Results  Component Value Date   WBC 6.1 01/23/2023   HGB 12.7 01/23/2023   HCT 39.4 01/23/2023   MCV 95 01/23/2023   MCH 30.5 01/23/2023   RDW 12.8 01/23/2023   PLT 206 01/23/2023   Last metabolic panel Lab Results  Component Value Date   GLUCOSE 103 (H) 09/22/2023   NA 141 09/22/2023   K 5.0 09/22/2023   CL 104 09/22/2023   CO2 25 09/22/2023   BUN 10 09/22/2023   CREATININE 0.61 09/22/2023   EGFR 95 09/22/2023   CALCIUM  10.0 09/22/2023   PROT 6.6 09/22/2023    ALBUMIN 4.5 09/22/2023   LABGLOB 2.1 09/22/2023   AGRATIO 2.2 01/23/2023   BILITOT 0.4 09/22/2023   ALKPHOS 75 09/22/2023   AST 14 09/22/2023   ALT 12 09/22/2023   ANIONGAP 9 12/11/2016   Last lipids Lab Results  Component Value Date   CHOL 147 01/23/2023   HDL 43 01/23/2023   LDLCALC 86 01/23/2023   TRIG 99 01/23/2023   CHOLHDL 3.4 01/23/2023   Last hemoglobin A1c Lab Results  Component Value Date   HGBA1C 6.0 (H) 09/22/2023   Last thyroid  functions Lab Results  Component Value Date   TSH 1.370 01/23/2023        Assessment & Plan:  Gastroenteritis  Left lower quadrant abdominal pain  Pneumonia of both lower lobes due to infectious organism  Hospital discharge follow-up   Shanyiah is a 73 year old Caucasian female seen today for posthospital discharge where she was treated for gastritis and pneumonia  -Hyperlipidemia: Crestor  10 mg refilled today   Encourage healthy lifestyle choices, including diet (rich in fruits, vegetables, and lean proteins, and low in salt and simple carbohydrates) and exercise (at least 30 minutes of moderate physical activity daily).     The above assessment and management plan was discussed with the patient. The patient verbalized understanding of and has agreed to the management plan. Patient is aware to call the clinic if they develop any new symptoms or if symptoms persist or worsen. Patient is aware when to return to the clinic for a follow-up visit. Patient educated on when it is appropriate to go to the emergency department.  Return if symptoms worsen or fail to improve.    Glyndon Tursi [place] Thompson, DNP [facility] [address] [place], [place] [address] [phone]    Note: This document was prepared by [employer] voice dictation technology and any errors that results from this process are unintentional.

## 2023-12-19 ENCOUNTER — Ambulatory Visit
Admission: RE | Admit: 2023-12-19 | Discharge: 2023-12-19 | Disposition: A | Discharge status: Home / Self Care | Payer: 59 | Source: Ambulatory Visit | Attending: Family | Admitting: Family

## 2023-12-19 DIAGNOSIS — Z1231 Encounter for screening mammogram for malignant neoplasm of breast: Secondary | ICD-10-CM | POA: Diagnosis not present

## 2023-12-23 ENCOUNTER — Ambulatory Visit (HOSPITAL_COMMUNITY)
Admission: RE | Admit: 2023-12-23 | Discharge: 2023-12-23 | Disposition: A | Discharge status: Home / Self Care | Payer: 59 | Source: Ambulatory Visit | Attending: Family | Admitting: Family

## 2023-12-23 ENCOUNTER — Telehealth: Payer: 59 | Admitting: Family

## 2023-12-23 ENCOUNTER — Encounter: Payer: Self-pay | Admitting: Family

## 2023-12-23 DIAGNOSIS — N3289 Other specified disorders of bladder: Secondary | ICD-10-CM | POA: Diagnosis not present

## 2023-12-23 DIAGNOSIS — R197 Diarrhea, unspecified: Secondary | ICD-10-CM | POA: Diagnosis not present

## 2023-12-23 DIAGNOSIS — R1032 Left lower quadrant pain: Secondary | ICD-10-CM

## 2023-12-23 DIAGNOSIS — K5730 Diverticulosis of large intestine without perforation or abscess without bleeding: Secondary | ICD-10-CM | POA: Diagnosis not present

## 2023-12-23 DIAGNOSIS — J189 Pneumonia, unspecified organism: Secondary | ICD-10-CM

## 2023-12-23 DIAGNOSIS — Z09 Encounter for follow-up examination after completed treatment for conditions other than malignant neoplasm: Secondary | ICD-10-CM

## 2023-12-23 MED ORDER — IOHEXOL 300 MG/ML  SOLN
100.0000 mL | Freq: Once | INTRAMUSCULAR | Status: AC | PRN
  Administered 2023-12-23: 100 mL via INTRAVENOUS

## 2023-12-23 NOTE — Progress Notes (Addendum)
 Virtual Visit Consent   [name], you are scheduled for a virtual visit with a [facility] provider today. Just as with appointments in the office, your consent must be obtained to participate. Your consent will be active for this visit and any virtual visit you may have with one of our providers in the next 365 days. If you have a MyChart account, a copy of this consent can be sent to you electronically.  As this is a virtual visit, video technology does not allow for your provider to perform a traditional examination. This may limit your provider's ability to fully assess your condition. If your provider identifies any concerns that need to be evaluated in person or the need to arrange testing (such as labs, EKG, etc.), we will make arrangements to do so. Although advances in technology are sophisticated, we cannot ensure that it will always work on either your end or our end. If the connection with a video visit is poor, the visit may have to be switched to a telephone visit. With either a video or telephone visit, we are not always able to ensure that we have a secure connection.  By engaging in this virtual visit, you consent to the provision of healthcare and authorize for your insurance to be billed (if applicable) for the services provided during this visit. Depending on your insurance coverage, you may receive a charge related to this service.  I need to obtain your verbal consent now. Are you willing to proceed with your visit today? KENNAH HEHR has provided verbal consent on 12/23/2023 for a virtual visit (video or telephone). Bari Learn, FNP  Date: 12/23/2023 10:42 AM  Virtual Visit via Video Note   I, Bari Learn, connected with  Heidi Graham  ([id number], Sep 20, 1951) on 12/23/23 at 10:25 AM EST by a video-enabled telemedicine application and verified that I am speaking with the correct person using two identifiers.  Location: Patient: Virtual Visit Location Patient:  Home Provider: Virtual Visit Location Provider: Home Office   I discussed the limitations of evaluation and management by telemedicine and the availability of in person appointments. The patient expressed understanding and agreed to proceed.    History of Present Illness: Heidi Graham is a 73 y.o. who identifies as a female who was assigned female at birth, and is being seen today for hospital follow up. She went to the ED 12/14/23 for weakness, vomiting, and diarrhea. She was diagnosed with gastroenteritis and pneumonia. She was given Levaquin 750 mg for 5 days. She completed this. She is complaining of left lower abdominal pain of 6 out 10.   HPI: Abdominal Pain This is a new problem. The current episode started 1 to 4 weeks ago. The problem has been waxing and waning. The pain is located in the LLQ. The pain is at a severity of 6/10. The pain is moderate. The quality of the pain is aching. Associated symptoms include diarrhea. Pertinent negatives include no constipation, fever, flatus, hematuria, myalgias, nausea or vomiting. She has tried antibiotics for the symptoms. The treatment provided mild relief.    Problems:  Patient Active Problem List   Diagnosis Date Noted   Atherosclerosis of coronary artery bypass graft with angina pectoris (HCC) 11/14/2020   Chronic bronchitis (HCC) 11/14/2020   Current smoker 01/20/2017   Hx of CABG 01/20/2017   Hyperlipidemia 09/19/2015   Diabetes (HCC) 06/29/2014   Hyperlipidemia associated with type 2 diabetes mellitus (HCC) 06/29/2014   Hypertension associated with diabetes (  HCC) 06/29/2014    Allergies:  Allergies  Allergen Reactions   Codeine Hives   Medications:  Current Outpatient Medications:    aspirin  EC 81 MG tablet, Take 1 tablet (81 mg total) by mouth daily. Swallow whole., Disp: 30 tablet, Rfl: 11   Blood Glucose Monitoring Suppl (ONETOUCH VERIO FLEX SYSTEM) w/Device KIT, Use to test blood sugar two times daily as directed. DX:E11.9,  Disp: 1 kit, Rfl: 0   glucose blood (ONETOUCH VERIO) test strip, Use to test blood sugar two times daily as directed. DX:E11.9, Disp: 100 each, Rfl: 12   ibuprofen (ADVIL) 400 MG tablet, Take 400 mg by mouth every 6 (six) hours as needed., Disp: , Rfl:    isosorbide  mononitrate (IMDUR ) 30 MG 24 hr tablet, Take 1 tablet (30 mg total) by mouth daily., Disp: 90 tablet, Rfl: 0   JARDIANCE  25 MG TABS tablet, TAKE 1 TABLET (25 MG TOTAL) BY MOUTH DAILY., Disp: 90 tablet, Rfl: 0   lisinopril  (ZESTRIL ) 10 MG tablet, Take 1 tablet (10 mg total) by mouth daily., Disp: 90 tablet, Rfl: 0   metFORMIN  (GLUCOPHAGE -XR) 750 MG 24 hr tablet, TAKE 2 TABLETS (1,500 MG TOTAL) BY MOUTH EVERY DAY WITH BREAKFAST, Disp: 180 tablet, Rfl: 0   metoprolol  succinate (TOPROL -XL) 25 MG 24 hr tablet, TAKE 1 TABLET BY MOUTH EVERY DAY, Disp: 90 tablet, Rfl: 0   Multiple Vitamin (THERA) TABS, Take by mouth., Disp: , Rfl:    nitroGLYCERIN (NITROSTAT) 0.4 MG SL tablet, Place 0.4 mg under the tongue every 5 (five) minutes as needed for chest pain., Disp: , Rfl:    OneTouch Delica Lancets 33G MISC, Use to test blood sugar two times daily as directed. DX:E11.9, Disp: 100 each, Rfl: 11   rosuvastatin  (CRESTOR ) 10 MG tablet, Take 1 tablet (10 mg total) by mouth daily., Disp: 90 tablet, Rfl: 0   Semaglutide ,0.25 or 0.5MG /DOS, (OZEMPIC , 0.25 OR 0.5 MG/DOSE,) 2 MG/3ML SOPN, INJECT 0.5 MG INTO THE SKIN ONE TIME PER WEEK, Disp: 9 mL, Rfl: 0  Observations/Objective: Patient is well-developed, well-nourished in no acute distress.  Resting comfortably  at home.  Head is normocephalic, atraumatic.  No labored breathing.  Speech is clear and coherent with logical content.  Patient is alert and oriented at baseline.  Left lower abdominal pain with palpation    Assessment and Plan: 1. Left lower quadrant abdominal pain (Primary) - CT ABDOMEN PELVIS W CONTRAST; Future  2. Diarrhea, unspecified type  3. Hospital discharge follow-up  4.  Pneumonia of both lower lobes due to infectious organism  Given continues pain will repeat CT scan. Her vomiting has resolved but continues to have LLQ pain that is constant. No fevers at this time.  Will need repeat chest x-ray in 2 weeks  NPO until scan today  Follow Up Instructions: I discussed the assessment and treatment plan with the patient. The patient was provided an opportunity to ask questions and all were answered. The patient agreed with the plan and demonstrated an understanding of the instructions.  A copy of instructions were sent to the patient via MyChart unless otherwise noted below.     The patient was advised to call back or seek an in-person evaluation if the symptoms worsen or if the condition fails to improve as anticipated.    Bari Learn, FNP

## 2023-12-24 ENCOUNTER — Encounter: Payer: Self-pay | Admitting: Nurse Practitioner

## 2023-12-24 ENCOUNTER — Ambulatory Visit: Payer: 59 | Admitting: Nurse Practitioner

## 2023-12-24 VITALS — BP 130/74 | HR 73 | Temp 98.0°F | Ht 63.0 in | Wt 144.2 lb

## 2023-12-24 DIAGNOSIS — Z7984 Long term (current) use of oral hypoglycemic drugs: Secondary | ICD-10-CM

## 2023-12-24 DIAGNOSIS — Z09 Encounter for follow-up examination after completed treatment for conditions other than malignant neoplasm: Secondary | ICD-10-CM | POA: Insufficient documentation

## 2023-12-24 DIAGNOSIS — E1169 Type 2 diabetes mellitus with other specified complication: Secondary | ICD-10-CM | POA: Diagnosis not present

## 2023-12-24 DIAGNOSIS — E785 Hyperlipidemia, unspecified: Secondary | ICD-10-CM

## 2023-12-24 DIAGNOSIS — J189 Pneumonia, unspecified organism: Secondary | ICD-10-CM | POA: Diagnosis not present

## 2023-12-24 DIAGNOSIS — R1032 Left lower quadrant pain: Secondary | ICD-10-CM | POA: Diagnosis not present

## 2023-12-24 DIAGNOSIS — K529 Noninfective gastroenteritis and colitis, unspecified: Secondary | ICD-10-CM | POA: Diagnosis not present

## 2023-12-24 MED ORDER — ROSUVASTATIN CALCIUM 10 MG PO TABS: 10.0000 mg | ORAL_TABLET | Freq: Every day | ORAL | 0 refills | Status: DC

## 2024-03-03 ENCOUNTER — Other Ambulatory Visit: Payer: Self-pay | Admitting: Family

## 2024-03-03 DIAGNOSIS — E1169 Type 2 diabetes mellitus with other specified complication: Secondary | ICD-10-CM

## 2024-03-10 ENCOUNTER — Other Ambulatory Visit: Payer: Self-pay | Admitting: Family

## 2024-03-10 DIAGNOSIS — E1159 Type 2 diabetes mellitus with other circulatory complications: Secondary | ICD-10-CM

## 2024-03-14 ENCOUNTER — Other Ambulatory Visit: Payer: Self-pay | Admitting: Family

## 2024-03-14 DIAGNOSIS — Z951 Presence of aortocoronary bypass graft: Secondary | ICD-10-CM

## 2024-03-14 DIAGNOSIS — Z1211 Encounter for screening for malignant neoplasm of colon: Secondary | ICD-10-CM | POA: Diagnosis not present

## 2024-03-15 ENCOUNTER — Other Ambulatory Visit: Payer: Self-pay | Admitting: Family

## 2024-03-15 DIAGNOSIS — E1169 Type 2 diabetes mellitus with other specified complication: Secondary | ICD-10-CM

## 2024-03-20 LAB — COLOGUARD: COLOGUARD: NEGATIVE

## 2024-04-03 ENCOUNTER — Other Ambulatory Visit: Payer: Self-pay | Admitting: Family

## 2024-04-03 DIAGNOSIS — E1169 Type 2 diabetes mellitus with other specified complication: Secondary | ICD-10-CM

## 2024-04-03 DIAGNOSIS — I152 Hypertension secondary to endocrine disorders: Secondary | ICD-10-CM

## 2024-04-06 ENCOUNTER — Other Ambulatory Visit: Payer: Self-pay | Admitting: Family

## 2024-04-06 ENCOUNTER — Encounter: Payer: Self-pay | Admitting: Family

## 2024-04-06 DIAGNOSIS — E1169 Type 2 diabetes mellitus with other specified complication: Secondary | ICD-10-CM

## 2024-04-06 NOTE — Telephone Encounter (Signed)
 [name] pt NTBS 30-d given 03/10/24

## 2024-04-06 NOTE — Telephone Encounter (Signed)
 LMTCB to schedule appt Letter mailed

## 2024-04-06 NOTE — Telephone Encounter (Signed)
 Heidi Graham pt NTBS 30-d given 03/15/24

## 2024-04-06 NOTE — Telephone Encounter (Signed)
 [name] pt NTBS 30-d given 03/03/24

## 2024-04-06 NOTE — Telephone Encounter (Signed)
 LMTCB to schedule appt Letter mailed for other med refills

## 2024-04-10 ENCOUNTER — Other Ambulatory Visit: Payer: Self-pay | Admitting: Family

## 2024-04-10 DIAGNOSIS — Z951 Presence of aortocoronary bypass graft: Secondary | ICD-10-CM

## 2024-04-10 DIAGNOSIS — E1169 Type 2 diabetes mellitus with other specified complication: Secondary | ICD-10-CM

## 2024-04-12 ENCOUNTER — Encounter: Payer: Self-pay | Admitting: Family

## 2024-04-12 NOTE — Telephone Encounter (Signed)
 [name] pt NTBS 30-d given 03/15/24

## 2024-04-12 NOTE — Telephone Encounter (Signed)
 LMTCB to schedule appt Letter mailed

## 2024-04-14 ENCOUNTER — Other Ambulatory Visit: Payer: Self-pay | Admitting: Family

## 2024-04-14 DIAGNOSIS — E1159 Type 2 diabetes mellitus with other circulatory complications: Secondary | ICD-10-CM

## 2024-04-14 DIAGNOSIS — E1169 Type 2 diabetes mellitus with other specified complication: Secondary | ICD-10-CM

## 2024-04-14 NOTE — Telephone Encounter (Signed)
 Copied from CRM [id number]. Topic: Clinical - Medication Refill >> Apr 14, 2024  3:31 PM Retta Caster wrote: Most Recent Primary Care Visit:   Medication: metFORMIN  (GLUCOPHAGE -XR) 750 MG 24 hr tablet metoprolol  succinate (TOPROL -XL) 25 MG 24 hr tablet Semaglutide ,0.25 or 0.5MG /DOS, (OZEMPIC , 0.25 OR 0.5 MG/DOSE,) 2 MG/3ML SOPN  Has the patient contacted their pharmacy? Yes (Agent: If no, request that the patient contact the pharmacy for the refill. If patient does not wish to contact the pharmacy document the reason why and proceed with request.) (Agent: If yes, when and what did the pharmacy advise?)  Is this the correct pharmacy for this prescription? Yes If no, delete pharmacy and type the correct one.  This is the patient's preferred pharmacy:  [id number] - [place], [place] - [address] [place] [address] Phone: [phone] Fax: [phone]   Has the prescription been filled recently? No  Is the patient out of the medication? Yes/Set app for 05/22 for medical review. Needs refill ASAP   Has the patient been seen for an appointment in the last year OR does the patient have an upcoming appointment? Yes  Can we respond through MyChart? Yes  Agent: Please be advised that Rx refills may take up to 3 business days. We ask that you follow-up with your pharmacy.

## 2024-04-27 ENCOUNTER — Other Ambulatory Visit: Payer: Self-pay | Admitting: Family Medicine

## 2024-04-27 DIAGNOSIS — Z951 Presence of aortocoronary bypass graft: Secondary | ICD-10-CM

## 2024-04-27 DIAGNOSIS — E1169 Type 2 diabetes mellitus with other specified complication: Secondary | ICD-10-CM

## 2024-04-27 DIAGNOSIS — E1159 Type 2 diabetes mellitus with other circulatory complications: Secondary | ICD-10-CM

## 2024-04-27 MED ORDER — METOPROLOL SUCCINATE ER 25 MG PO TB24: 25.0000 mg | ORAL_TABLET | Freq: Every day | ORAL | 0 refills | Status: DC

## 2024-04-27 MED ORDER — OZEMPIC (0.25 OR 0.5 MG/DOSE) 2 MG/3ML ~~LOC~~ SOPN: PEN_INJECTOR | SUBCUTANEOUS | 0 refills | Status: DC

## 2024-04-27 MED ORDER — ISOSORBIDE MONONITRATE ER 30 MG PO TB24
30.0000 mg | ORAL_TABLET | Freq: Every day | ORAL | 0 refills | Status: DC
Start: 2024-04-27 — End: 2024-05-06

## 2024-04-27 MED ORDER — METFORMIN HCL ER 750 MG PO TB24: ORAL_TABLET | ORAL | 0 refills | Status: DC

## 2024-04-29 ENCOUNTER — Other Ambulatory Visit: Payer: Self-pay | Admitting: Nurse Practitioner

## 2024-04-29 DIAGNOSIS — E1169 Type 2 diabetes mellitus with other specified complication: Secondary | ICD-10-CM

## 2024-05-06 ENCOUNTER — Encounter: Payer: Self-pay | Admitting: Family

## 2024-05-06 ENCOUNTER — Ambulatory Visit: Admitting: Family

## 2024-05-06 VITALS — BP 125/51 | HR 65 | Temp 97.3°F | Ht 63.0 in | Wt 141.2 lb

## 2024-05-06 DIAGNOSIS — E1169 Type 2 diabetes mellitus with other specified complication: Secondary | ICD-10-CM | POA: Diagnosis not present

## 2024-05-06 DIAGNOSIS — Z0000 Encounter for general adult medical examination without abnormal findings: Secondary | ICD-10-CM | POA: Diagnosis not present

## 2024-05-06 DIAGNOSIS — Z951 Presence of aortocoronary bypass graft: Secondary | ICD-10-CM | POA: Diagnosis not present

## 2024-05-06 DIAGNOSIS — J42 Unspecified chronic bronchitis: Secondary | ICD-10-CM | POA: Diagnosis not present

## 2024-05-06 DIAGNOSIS — I25709 Atherosclerosis of coronary artery bypass graft(s), unspecified, with unspecified angina pectoris: Secondary | ICD-10-CM

## 2024-05-06 DIAGNOSIS — J069 Acute upper respiratory infection, unspecified: Secondary | ICD-10-CM | POA: Diagnosis not present

## 2024-05-06 DIAGNOSIS — F17200 Nicotine dependence, unspecified, uncomplicated: Secondary | ICD-10-CM | POA: Diagnosis not present

## 2024-05-06 DIAGNOSIS — E1159 Type 2 diabetes mellitus with other circulatory complications: Secondary | ICD-10-CM

## 2024-05-06 DIAGNOSIS — Z0001 Encounter for general adult medical examination with abnormal findings: Secondary | ICD-10-CM

## 2024-05-06 DIAGNOSIS — I152 Hypertension secondary to endocrine disorders: Secondary | ICD-10-CM

## 2024-05-06 DIAGNOSIS — E785 Hyperlipidemia, unspecified: Secondary | ICD-10-CM | POA: Diagnosis not present

## 2024-05-06 LAB — BAYER DCA HB A1C WAIVED: HB A1C (BAYER DCA - WAIVED): 6.4 % — ABNORMAL HIGH (ref 4.8–5.6)

## 2024-05-06 MED ORDER — ISOSORBIDE MONONITRATE ER 30 MG PO TB24: 30.0000 mg | ORAL_TABLET | Freq: Every day | ORAL | 2 refills | Status: DC

## 2024-05-06 MED ORDER — FLUTICASONE PROPIONATE 50 MCG/ACT NA SUSP: 2.0000 | Freq: Every day | NASAL | 6 refills | Status: AC

## 2024-05-06 MED ORDER — METOPROLOL SUCCINATE ER 25 MG PO TB24: 25.0000 mg | ORAL_TABLET | Freq: Every day | ORAL | 2 refills | Status: DC

## 2024-05-06 MED ORDER — CETIRIZINE HCL 10 MG PO TABS: 10.0000 mg | ORAL_TABLET | Freq: Every day | ORAL | 1 refills | Status: AC

## 2024-05-06 MED ORDER — BENZONATATE 200 MG PO CAPS: 200.0000 mg | ORAL_CAPSULE | Freq: Three times a day (TID) | ORAL | 1 refills | Status: DC | PRN

## 2024-05-06 MED ORDER — EMPAGLIFLOZIN 25 MG PO TABS: 25.0000 mg | ORAL_TABLET | Freq: Every day | ORAL | 2 refills | Status: DC

## 2024-05-06 MED ORDER — OZEMPIC (0.25 OR 0.5 MG/DOSE) 2 MG/3ML ~~LOC~~ SOPN: PEN_INJECTOR | SUBCUTANEOUS | 2 refills | Status: DC

## 2024-05-06 MED ORDER — LISINOPRIL 10 MG PO TABS: 10.0000 mg | ORAL_TABLET | Freq: Every day | ORAL | 2 refills | Status: DC

## 2024-05-06 MED ORDER — ROSUVASTATIN CALCIUM 10 MG PO TABS: 10.0000 mg | ORAL_TABLET | Freq: Every day | ORAL | 3 refills | Status: DC

## 2024-05-06 NOTE — Patient Instructions (Signed)

## 2024-05-06 NOTE — Progress Notes (Signed)
 Subjective:    Patient ID: Heidi Graham, female    DOB: 1951/05/19, 73 y.o.   MRN: [id number]  Chief Complaint  Patient presents with   Medical Management of Chronic Issues   Sinusitis    CONGESTION FOR 3 DAYS    Pt presents to the office today for CPE and chronic follow up.    PT had CABG in 2011 and 2017 is followed by Cardiologists annually.    She has atherosclerosis and takes Crestor  daily.    She has COPD and continues to smoke 1/4 pack a day. She has intermittent SOB.   Hypertension This is a chronic problem. The current episode started more than 1 year ago. The problem has been resolved since onset. The problem is controlled. Associated symptoms include headaches. Pertinent negatives include no blurred vision, malaise/fatigue, peripheral edema or shortness of breath. Risk factors for coronary artery disease include dyslipidemia, diabetes mellitus and sedentary lifestyle. The current treatment provides moderate improvement.  Diabetes She presents for her follow-up diabetic visit. She has type 2 diabetes mellitus. Hypoglycemia symptoms include headaches. Pertinent negatives for diabetes include no blurred vision and no foot paresthesias. Risk factors for coronary artery disease include dyslipidemia, diabetes mellitus, hypertension, sedentary lifestyle and post-menopausal. She is following a generally healthy diet. Her overall blood glucose range is 110-130 mg/dl. Eye exam is current.  Hyperlipidemia This is a chronic problem. The current episode started more than 1 year ago. The problem is controlled. Recent lipid tests were reviewed and are normal. Pertinent negatives include no shortness of breath. Current antihyperlipidemic treatment includes statins. The current treatment provides moderate improvement of lipids. Risk factors for coronary artery disease include dyslipidemia, diabetes mellitus, hypertension, a sedentary lifestyle and post-menopausal.  Nicotine Dependence Presents  for follow-up visit. Symptoms include sore throat (improved). The symptoms have been stable. She smokes < 1/2 a pack of cigarettes per day.  Sinusitis This is a new problem. The current episode started in the past 7 days. The problem has been gradually worsening since onset. Maximum temperature: 35F. Her pain is at a severity of 2/10. The pain is mild. Associated symptoms include congestion, coughing, headaches, a hoarse voice, sinus pressure, sneezing and a sore throat (improved). Pertinent negatives include no shortness of breath. Past treatments include acetaminophen. The treatment provided mild relief.      Review of Systems  Constitutional:  Negative for malaise/fatigue.  HENT:  Positive for congestion, hoarse voice, sinus pressure, sneezing and sore throat (improved).   Eyes:  Negative for blurred vision.  Respiratory:  Positive for cough. Negative for shortness of breath.   Neurological:  Positive for headaches.  All other systems reviewed and are negative.  Family History  Problem Relation Age of Onset   Diabetes Mother    Hypertension Mother    Diabetes Sister        type 2   Hypertension Sister    Diabetes Daughter    Diabetes Brother        type 1   Diabetes Son    Breast cancer Neg Hx    Social History   Socioeconomic History   Marital status: Widowed    Spouse name: Not on file   Number of children: 3   Years of education: 19   Highest education level: 12th grade  Occupational History   Occupation: retired   Tobacco Use   Smoking status: Former    Current packs/day: 0.00    Average packs/day: 0.3 packs/day for 28.0 years (8.4  ttl pk-yrs)    Types: Cigarettes    Start date: 01/16/1994    Quit date: 01/16/2022    Years since quitting: 2.3    Passive exposure: Past   Smokeless tobacco: Never   Tobacco comments:    smoked for 20 years - quit for 9 years and restarted about 6 yeasr ago.    Quit again 01/16/2022  Vaping Use   Vaping status: Never Used  Substance  and Sexual Activity   Alcohol use: No   Drug use: No   Sexual activity: Not Currently    Partners: Male  Other Topics Concern   Not on file  Social History Narrative   Lives with daughter and 2 grandchildren    Son is truck Hospital doctor and lives in [place]   Other son in [place], [place]   Social Drivers of Health   Financial Resource Strain: Low Risk  (10/02/2022)   Overall Financial Resource Strain (CARDIA)    Difficulty of Paying Living Expenses: Not hard at all  Food Insecurity: No Food Insecurity (10/02/2022)   Hunger Vital Sign    Worried About Running Out of Food in the Last Year: Never true    Ran Out of Food in the Last Year: Never true  Transportation Needs: No Transportation Needs (10/02/2022)   PRAPARE - Administrator, Civil Service (Medical): No    Lack of Transportation (Non-Medical): No  Physical Activity: Sufficiently Active (10/02/2022)   Exercise Vital Sign    Days of Exercise per Week: 7 days    Minutes of Exercise per Session: 30 min  Stress: No Stress Concern Present (10/02/2022)   [employer] of Occupational Health - Occupational Stress Questionnaire    Feeling of Stress : Not at all  Social Connections: Moderately Integrated (10/02/2022)   Social Connection and Isolation Panel [NHANES]    Frequency of Communication with Friends and Family: More than three times a week    Frequency of Social Gatherings with Friends and Family: More than three times a week    Attends Religious Services: More than 4 times per year    Active Member of [employer] or Organizations: Yes    Attends Banker Meetings: More than 4 times per year    Marital Status: Widowed       Objective:   Physical Exam Vitals reviewed.  Constitutional:      General: She is not in acute distress.    Appearance: She is well-developed.  HENT:     Head: Normocephalic and atraumatic.     Right Ear: Tympanic membrane normal.     Left Ear: Tympanic membrane normal.      Nose:     Right Sinus: Frontal sinus tenderness present.     Left Sinus: Frontal sinus tenderness present.  Eyes:     Pupils: Pupils are equal, round, and reactive to light.  Neck:     Thyroid : No thyromegaly.  Cardiovascular:     Rate and Rhythm: Normal rate and regular rhythm.     Heart sounds: Normal heart sounds. No murmur heard. Pulmonary:     Effort: Pulmonary effort is normal. No respiratory distress.     Breath sounds: Normal breath sounds. No wheezing.  Abdominal:     General: Bowel sounds are normal. There is no distension.     Palpations: Abdomen is soft.     Tenderness: There is no abdominal tenderness.  Musculoskeletal:        General: No tenderness. Normal range  of motion.     Cervical back: Normal range of motion and neck supple.  Skin:    General: Skin is warm and dry.  Neurological:     Mental Status: She is alert and oriented to person, place, and time.     Cranial Nerves: No cranial nerve deficit.     Deep Tendon Reflexes: Reflexes are normal and symmetric.  Psychiatric:        Behavior: Behavior normal.        Thought Content: Thought content normal.        Judgment: Judgment normal.    Diabetic Foot Exam - Simple   Simple Foot Form Diabetic Foot exam was performed with the following findings: Yes 05/06/2024  9:33 AM  Visual Inspection No deformities, no ulcerations, no other skin breakdown bilaterally: Yes Sensation Testing Intact to touch and monofilament testing bilaterally: Yes Pulse Check Posterior Tibialis and Dorsalis pulse intact bilaterally: Yes Comments Toenails long and thick      BP (!) 125/51   Pulse 65   Temp (!) 97.3 F (36.3 C)   Ht 5\' 3"  (1.6 m)   Wt 141 lb 3.2 oz (64 kg)   SpO2 99%   BMI 25.01 kg/m      Assessment & Plan:  Heidi Graham comes in today with chief complaint of Medical Management of Chronic Issues and Sinusitis (CONGESTION FOR 3 DAYS )   Diagnosis and orders addressed:  1. Hx of CABG - CBC with  Differential/Platelet - CMP14+EGFR - isosorbide  mononitrate (IMDUR ) 30 MG 24 hr tablet; Take 1 tablet (30 mg total) by mouth daily.  Dispense: 90 tablet; Refill: 2  2. Type 2 diabetes mellitus with other specified complication, without long-term current use of insulin (HCC) - CBC with Differential/Platelet - Bayer DCA Hb A1c Waived - CMP14+EGFR - Vitamin B12 - empagliflozin  (JARDIANCE ) 25 MG TABS tablet; Take 1 tablet (25 mg total) by mouth daily.  Dispense: 90 tablet; Refill: 2 - Semaglutide ,0.25 or 0.5MG /DOS, (OZEMPIC , 0.25 OR 0.5 MG/DOSE,) 2 MG/3ML SOPN; INJECT 0.5 MG INTO THE SKIN ONE TIME PER WEEK **NEEDS TO BE SEEN BEFORE NEXT REFILL**  Dispense: 3 mL; Refill: 2  3. Hypertension associated with diabetes (HCC) - CBC with Differential/Platelet - CMP14+EGFR - metoprolol  succinate (TOPROL -XL) 25 MG 24 hr tablet; Take 1 tablet (25 mg total) by mouth daily.  Dispense: 90 tablet; Refill: 2  4. Hyperlipidemia associated with type 2 diabetes mellitus (HCC) - CBC with Differential/Platelet - CMP14+EGFR - Lipid panel - rosuvastatin  (CRESTOR ) 10 MG tablet; Take 1 tablet (10 mg total) by mouth daily.  Dispense: 90 tablet; Refill: 3  5. Current smoker  - CBC with Differential/Platelet - CMP14+EGFR  6. Hyperlipidemia, unspecified hyperlipidemia type - CBC with Differential/Platelet - CMP14+EGFR  7. Atherosclerosis of coronary artery bypass graft with angina pectoris, unspecified whether native or transplanted heart (HCC)  - CBC with Differential/Platelet - CMP14+EGFR  8. Chronic bronchitis, unspecified chronic bronchitis type (HCC)  - CBC with Differential/Platelet - CMP14+EGFR  9. Annual physical exam (Primary) - CBC with Differential/Platelet - Bayer DCA Hb A1c Waived - CMP14+EGFR - Lipid panel - Vitamin B12  10. Viral URI - Take meds as prescribed - Use a cool mist humidifier  -Use saline nose sprays frequently -Force fluids -For any cough or congestion  Use plain  Mucinex- regular strength or max strength is fine -For fever or aces or pains- take tylenol or ibuprofen. -Throat lozenges if help -Follow up if symptoms worsen or do not improve  -  fluticasone (FLONASE) 50 MCG/ACT nasal spray; Place 2 sprays into both nostrils daily.  Dispense: 16 g; Refill: 6 - cetirizine (ZYRTEC ALLERGY) 10 MG tablet; Take 1 tablet (10 mg total) by mouth daily.  Dispense: 90 tablet; Refill: 1 - benzonatate  (TESSALON ) 200 MG capsule; Take 1 capsule (200 mg total) by mouth 3 (three) times daily as needed.  Dispense: 30 capsule; Refill: 1    Labs pending Continue current medications  Health Maintenance reviewed Diet and exercise encouraged  Follow up plan: 3 months    Tommas Fragmin, FNP

## 2024-05-07 ENCOUNTER — Ambulatory Visit: Payer: Self-pay | Admitting: Family

## 2024-05-07 LAB — CBC WITH DIFFERENTIAL/PLATELET
Basophils Absolute: 0 10*3/uL (ref 0.0–0.2)
Basos: 1 %
EOS (ABSOLUTE): 0.1 10*3/uL (ref 0.0–0.4)
Eos: 1 %
Hematocrit: 44.5 % (ref 34.0–46.6)
Hemoglobin: 14 g/dL (ref 11.1–15.9)
Immature Grans (Abs): 0 10*3/uL (ref 0.0–0.1)
Immature Granulocytes: 0 %
Lymphocytes Absolute: 1.8 10*3/uL (ref 0.7–3.1)
Lymphs: 38 %
MCH: 30.4 pg (ref 26.6–33.0)
MCHC: 31.5 g/dL (ref 31.5–35.7)
MCV: 97 fL (ref 79–97)
Monocytes Absolute: 0.3 10*3/uL (ref 0.1–0.9)
Monocytes: 5 %
Neutrophils Absolute: 2.7 10*3/uL (ref 1.4–7.0)
Neutrophils: 55 %
Platelets: 204 10*3/uL (ref 150–450)
RBC: 4.61 x10E6/uL (ref 3.77–5.28)
RDW: 13 % (ref 11.7–15.4)
WBC: 4.8 10*3/uL (ref 3.4–10.8)

## 2024-05-07 LAB — CMP14+EGFR
ALT: 16 IU/L (ref 0–32)
AST: 20 IU/L (ref 0–40)
Albumin: 4.4 g/dL (ref 3.8–4.8)
Alkaline Phosphatase: 78 IU/L (ref 44–121)
BUN/Creatinine Ratio: 20 (ref 12–28)
BUN: 15 mg/dL (ref 8–27)
Bilirubin Total: 0.4 mg/dL (ref 0.0–1.2)
CO2: 22 mmol/L (ref 20–29)
Calcium: 9.4 mg/dL (ref 8.7–10.3)
Chloride: 105 mmol/L (ref 96–106)
Creatinine, Ser: 0.74 mg/dL (ref 0.57–1.00)
Globulin, Total: 2.3 g/dL (ref 1.5–4.5)
Glucose: 117 mg/dL — ABNORMAL HIGH (ref 70–99)
Potassium: 4.1 mmol/L (ref 3.5–5.2)
Sodium: 142 mmol/L (ref 134–144)
Total Protein: 6.7 g/dL (ref 6.0–8.5)
eGFR: 85 mL/min/{1.73_m2} (ref >59)

## 2024-05-07 LAB — LIPID PANEL
Chol/HDL Ratio: 3.7 ratio (ref 0.0–4.4)
Cholesterol, Total: 146 mg/dL (ref 100–199)
HDL: 39 mg/dL — ABNORMAL LOW (ref >39)
LDL Chol Calc (NIH): 89 mg/dL (ref 0–99)
Triglycerides: 93 mg/dL (ref 0–149)
VLDL Cholesterol Cal: 18 mg/dL (ref 5–40)

## 2024-05-07 LAB — VITAMIN B12: Vitamin B-12: 342 pg/mL (ref 232–1245)

## 2024-05-14 ENCOUNTER — Ambulatory Visit

## 2024-05-14 ENCOUNTER — Other Ambulatory Visit (HOSPITAL_COMMUNITY): Payer: Self-pay

## 2024-05-14 VITALS — Ht 63.0 in | Wt 141.0 lb

## 2024-05-14 DIAGNOSIS — Z7984 Long term (current) use of oral hypoglycemic drugs: Secondary | ICD-10-CM

## 2024-05-14 DIAGNOSIS — I25709 Atherosclerosis of coronary artery bypass graft(s), unspecified, with unspecified angina pectoris: Secondary | ICD-10-CM

## 2024-05-14 DIAGNOSIS — E1169 Type 2 diabetes mellitus with other specified complication: Secondary | ICD-10-CM | POA: Diagnosis not present

## 2024-05-14 DIAGNOSIS — Z0001 Encounter for general adult medical examination with abnormal findings: Secondary | ICD-10-CM

## 2024-05-14 DIAGNOSIS — Z0000 Encounter for general adult medical examination without abnormal findings: Secondary | ICD-10-CM

## 2024-05-14 DIAGNOSIS — Z951 Presence of aortocoronary bypass graft: Secondary | ICD-10-CM | POA: Diagnosis not present

## 2024-05-14 DIAGNOSIS — Z7985 Long-term (current) use of injectable non-insulin antidiabetic drugs: Secondary | ICD-10-CM

## 2024-05-14 MED ORDER — BLOOD GLUCOSE MONITOR SYSTEM W/DEVICE KIT
1.0000 | PACK | Freq: Three times a day (TID) | 0 refills | Status: AC
  Filled 2024-05-14: qty 1, 30d supply, fill #0

## 2024-05-14 MED ORDER — BENZONATATE 200 MG PO CAPS
200.0000 mg | ORAL_CAPSULE | Freq: Three times a day (TID) | ORAL | 0 refills | Status: DC | PRN
  Filled 2024-05-14: qty 30, 10d supply, fill #0

## 2024-05-14 MED ORDER — ACCU-CHEK SOFTCLIX LANCETS MISC
1.0000 | Freq: Three times a day (TID) | 0 refills | Status: AC
Start: 2024-05-14 — End: 2024-06-13
  Filled 2024-05-14: qty 100, 30d supply, fill #0

## 2024-05-14 MED ORDER — LANCET DEVICE MISC
1.0000 | Freq: Three times a day (TID) | 0 refills | Status: AC
  Filled 2024-05-14: qty 1, 30d supply, fill #0

## 2024-05-14 MED ORDER — BLOOD GLUCOSE TEST VI STRP
1.0000 | ORAL_STRIP | Freq: Three times a day (TID) | 0 refills | Status: AC
  Filled 2024-05-14: qty 100, 34d supply, fill #0

## 2024-05-14 NOTE — Progress Notes (Signed)
 Subjective:   Heidi Graham is a 73 y.o. who presents for a Medicare Wellness preventive visit.  As a reminder, Annual Wellness Visits don't include a physical exam, and some assessments may be limited, especially if this visit is performed virtually. We may recommend an in-person follow-up visit with your provider if needed.  Visit Complete: Virtual I connected with  Heidi Graham on 05/14/24 by a video and audio enabled telemedicine application and verified that I am speaking with the correct person using two identifiers.  Patient Location: Home  Provider Location: Home Office  I discussed the limitations of evaluation and management by telemedicine. The patient expressed understanding and agreed to proceed.  Vital Signs: Because this visit was a virtual/telehealth visit, some criteria may be missing or patient reported. Any vitals not documented were not able to be obtained and vitals that have been documented are patient reported.  Persons Participating in Visit: Patient assisted by daughter Heidi Graham.  AWV Questionnaire: No: Patient Medicare AWV questionnaire was not completed prior to this visit.  Cardiac Risk Factors include: advanced age (>39men, >41 women);diabetes mellitus;dyslipidemia;hypertension     Objective:     Today's Vitals   05/14/24 0859  Weight: 141 lb (64 kg)  Height: 5\' 3"  (1.6 m)   Body mass index is 24.98 kg/m.     05/14/2024    9:21 AM 10/02/2022   12:01 PM 02/01/2022   11:26 AM 01/31/2021   11:12 AM 08/18/2018    1:42 PM 01/21/2017   10:39 AM 12/11/2016   12:22 PM  Advanced Directives  Does Patient Have a Medical Advance Directive? No Yes No No No No No  Type of Furniture conservator/restorer;Living will       Does patient want to make changes to medical advance directive?  No - Patient declined       Copy of Healthcare Power of Attorney in Chart?  No - copy requested       Would patient like information on creating a medical  advance directive? No - Patient declined  No - Patient declined No - Patient declined Yes (MAU/Ambulatory/Procedural Areas - Information given) Yes (MAU/Ambulatory/Procedural Areas - Information given) No - Patient declined    Current Medications (verified) Outpatient Encounter Medications as of 05/14/2024  Medication Sig   aspirin  EC 81 MG tablet Take 1 tablet (81 mg total) by mouth daily. Swallow whole.   benzonatate  (TESSALON ) 200 MG capsule Take 1 capsule (200 mg total) by mouth 3 (three) times daily as needed.   Blood Glucose Monitoring Suppl (ONETOUCH VERIO FLEX SYSTEM) w/Device KIT Use to test blood sugar two times daily as directed. DX:E11.9   cetirizine (ZYRTEC ALLERGY) 10 MG tablet Take 1 tablet (10 mg total) by mouth daily.   empagliflozin  (JARDIANCE ) 25 MG TABS tablet Take 1 tablet (25 mg total) by mouth daily.   fluticasone (FLONASE) 50 MCG/ACT nasal spray Place 2 sprays into both nostrils daily.   glucose blood (ONETOUCH VERIO) test strip Use to test blood sugar two times daily as directed. DX:E11.9   ibuprofen (ADVIL) 400 MG tablet Take 400 mg by mouth every 6 (six) hours as needed.   isosorbide  mononitrate (IMDUR ) 30 MG 24 hr tablet Take 1 tablet (30 mg total) by mouth daily.   lisinopril  (ZESTRIL ) 10 MG tablet Take 1 tablet (10 mg total) by mouth daily.   metFORMIN  (GLUCOPHAGE -XR) 750 MG 24 hr tablet TAKE 2 TABLETS (1,500 MG TOTAL) BY MOUTH EVERY DAY WITH  BREAKFAST **NEEDS TO BE SEEN BEFORE NEXT REFILL**   metoprolol  succinate (TOPROL -XL) 25 MG 24 hr tablet Take 1 tablet (25 mg total) by mouth daily.   Multiple Vitamin (THERA) TABS Take by mouth.   nitroGLYCERIN (NITROSTAT) 0.4 MG SL tablet Place 0.4 mg under the tongue every 5 (five) minutes as needed for chest pain.   OneTouch Delica Lancets 33G MISC Use to test blood sugar two times daily as directed. DX:E11.9   rosuvastatin  (CRESTOR ) 10 MG tablet Take 1 tablet (10 mg total) by mouth daily.   Semaglutide ,0.25 or 0.5MG /DOS,  (OZEMPIC , 0.25 OR 0.5 MG/DOSE,) 2 MG/3ML SOPN INJECT 0.5 MG INTO THE SKIN ONE TIME PER WEEK **NEEDS TO BE SEEN BEFORE NEXT REFILL**   No facility-administered encounter medications on file as of 05/14/2024.    Allergies (verified) Codeine   History: Past Medical History:  Diagnosis Date   Arthritis    Cataract    Diabetes mellitus without complication (HCC)    Hyperlipidemia    Hypertension    Myocardial infarction [facility]) 2011   Past Surgical History:  Procedure Laterality Date   CARDIAC SURGERY     Triple  Bypass   CESAREAN SECTION     two   CORONARY ARTERY BYPASS GRAFT     HERNIA REPAIR     umbilical   Family History  Problem Relation Age of Onset   Diabetes Mother    Hypertension Mother    Diabetes Sister        type 2   Hypertension Sister    Diabetes Daughter    Diabetes Brother        type 1   Diabetes Son    Breast cancer Neg Hx    Social History   Socioeconomic History   Marital status: Widowed    Spouse name: Not on file   Number of children: 3   Years of education: 68   Highest education level: 12th grade  Occupational History   Occupation: retired   Tobacco Use   Smoking status: Former    Current packs/day: 0.00    Average packs/day: 0.3 packs/day for 28.0 years (8.4 ttl pk-yrs)    Types: Cigarettes    Start date: 01/16/1994    Quit date: 01/16/2022    Years since quitting: 2.3    Passive exposure: Past   Smokeless tobacco: Never   Tobacco comments:    smoked for 20 years - quit for 9 years and restarted about 6 yeasr ago.    Quit again 01/16/2022  Vaping Use   Vaping status: Never Used  Substance and Sexual Activity   Alcohol use: No   Drug use: No   Sexual activity: Not Currently    Partners: Male  Other Topics Concern   Not on file  Social History Narrative   Lives with daughter and 2 grandchildren    Son is truck Hospital doctor and lives in [place]   Other son in [place], [place]   Social Drivers of Health   Financial Resource Strain:  Low Risk  (05/14/2024)   Overall Financial Resource Strain (CARDIA)    Difficulty of Paying Living Expenses: Not hard at all  Food Insecurity: No Food Insecurity (05/14/2024)   Hunger Vital Sign    Worried About Running Out of Food in the Last Year: Never true    Ran Out of Food in the Last Year: Never true  Transportation Needs: No Transportation Needs (05/14/2024)   PRAPARE - Transportation    Lack of Transportation (  Medical): No    Lack of Transportation (Non-Medical): No  Physical Activity: Sufficiently Active (05/14/2024)   Exercise Vital Sign    Days of Exercise per Week: 7 days    Minutes of Exercise per Session: 30 min  Stress: No Stress Concern Present (05/14/2024)   [employer] of Occupational Health - Occupational Stress Questionnaire    Feeling of Stress : Not at all  Social Connections: Moderately Integrated (05/14/2024)   Social Connection and Isolation Panel [NHANES]    Frequency of Communication with Friends and Family: More than three times a week    Frequency of Social Gatherings with Friends and Family: More than three times a week    Attends Religious Services: More than 4 times per year    Active Member of [employer] or Organizations: Yes    Attends Banker Meetings: More than 4 times per year    Marital Status: Widowed    Tobacco Counseling Counseling given: Yes Tobacco comments: smoked for 20 years - quit for 9 years and restarted about 6 yeasr ago. Quit again 01/16/2022    Clinical Intake:  Pre-visit preparation completed: Yes  Pain : No/denies pain     BMI - recorded: 24.98 Nutritional Status: BMI of 19-24  Normal Nutritional Risks: None Diabetes: Yes CBG done?: No (telehealth visit.) Did pt. bring in CBG monitor from home?: No  Lab Results  Component Value Date   HGBA1C 6.4 (H) 05/06/2024   HGBA1C 6.0 (H) 09/22/2023   HGBA1C 8.0 (H) 01/23/2023     How often do you need to have someone help you when you read instructions,  pamphlets, or other written materials from your doctor or pharmacy?: 1 - Never  Interpreter Needed?: No  Information entered by :: Sally Crazier CMA   Activities of Daily Living     05/14/2024    9:20 AM  In your present state of health, do you have any difficulty performing the following activities:  Hearing? 0  Vision? 0  Difficulty concentrating or making decisions? 0  Walking or climbing stairs? 0  Dressing or bathing? 0  Doing errands, shopping? 1  Preparing Food and eating ? Y  Using the Toilet? N  In the past six months, have you accidently leaked urine? N  Do you have problems with loss of bowel control? N  Managing your Medications? Y  Managing your Finances? N  Housekeeping or managing your Housekeeping? Y    Patient Care Team: Yevette Hem, FNP as PCP - General (Nurse Practitioner) Grover Ledger, Cathleen Coach, MD as Referring Physician (Internal Medicine) Alexia Idler, OD (Optometry)  Indicate any recent Medical Services you may have received from other than [facility] providers in the past year (date may be approximate).     Assessment:    This is a routine wellness examination for Heidi Graham.  Hearing/Vision screen Hearing Screening - Comments:: Patient wears hearing aids.  Vision Screening - Comments:: Wears rx glasses - up to date with routine eye exams  Patient sees [facility]     Goals Addressed             This Visit's Progress    Patient Stated       I'd like to go to the beach        Depression Screen     05/14/2024    9:03 AM 05/06/2024    9:14 AM 09/22/2023   11:10 AM 01/23/2023    1:49 PM 01/09/2023    8:09  AM 10/02/2022   11:56 AM 02/01/2022   11:24 AM  PHQ 2/9 Scores  PHQ - 2 Score 0 0 0 0 0 0 0  PHQ- 9 Score 0 0 0 0 0      Fall Risk     05/14/2024    9:20 AM 05/06/2024    9:13 AM 09/22/2023   11:14 AM 01/23/2023    1:49 PM 01/09/2023    8:08 AM  Fall Risk   Falls in the past year? 0 0 0 0 0  Number falls in past yr: 0 0  0    Injury with Fall? 0 0 0    Risk for fall due to : No Fall Risks No Fall Risks Orthopedic patient    Follow up Falls evaluation completed Falls evaluation completed Falls evaluation completed;Education provided      MEDICARE RISK AT HOME:  Medicare Risk at Home Any stairs in or around the home?: Yes Adequate lighting in your home to reduce risk of falls?: Yes Life alert?: No Use of a cane, walker or w/c?: No Grab bars in the bathroom?: Yes Shower chair or bench in shower?: No Elevated toilet seat or a handicapped toilet?: No  TIMED UP AND GO:  Was the test performed?  No  Cognitive Function: 6CIT completed    08/18/2018    2:24 PM 01/21/2017   10:59 AM  MMSE - Mini Mental State Exam  Orientation to time 5 5  Orientation to Place 5 4  Registration 3 3  Attention/ Calculation 5 5  Recall 3 2  Language- name 2 objects 2 2  Language- repeat 1 1  Language- follow 3 step command 3 3  Language- read & follow direction 1 1  Write a sentence 1 1  Copy design 1 1  Total score 30 28        05/14/2024    9:21 AM 01/31/2021   11:14 AM  6CIT Screen  What Year? 0 points 0 points  What month? 0 points 0 points  What time? 0 points 0 points  Count back from 20 0 points 0 points  Months in reverse 0 points 0 points  Repeat phrase 0 points 0 points  Total Score 0 points 0 points    Immunizations Immunization History  Administered Date(s) Administered   Influenza, High Dose Seasonal PF 10/08/2017, 11/09/2018, 09/07/2019, 10/05/2021, 09/24/2022, 09/25/2023   Influenza, Seasonal, Injecte, Preservative Fre 11/17/2014   Influenza,inj,Quad PF,6+ Mos 11/17/2014, 10/19/2015   Influenza-Unspecified 11/17/2014, 10/15/2017, 10/05/2020   PNEUMOCOCCAL CONJUGATE-20 01/23/2023   Pneumococcal Polysaccharide-23 08/18/2018   Tdap 11/20/2020    Screening Tests Health Maintenance  Topic Date Due   COVID-19 Vaccine (1) 05/22/2024 (Originally 02/22/1956)   Zoster Vaccines- Shingrix (1 of 2)  08/06/2024 (Originally 02/21/1970)   OPHTHALMOLOGY EXAM  07/06/2024   INFLUENZA VACCINE  07/16/2024   Diabetic kidney evaluation - Urine ACR  09/21/2024   HEMOGLOBIN A1C  11/06/2024   MAMMOGRAM  12/18/2024   Diabetic kidney evaluation - eGFR measurement  05/06/2025   FOOT EXAM  05/06/2025   Medicare Annual Wellness (AWV)  05/14/2025   DEXA SCAN  09/21/2025   Fecal DNA (Cologuard)  03/15/2027   DTaP/Tdap/Td (2 - Td or Tdap) 11/20/2030   Pneumonia Vaccine 40+ Years old  Completed   Hepatitis C Screening  Completed   HPV VACCINES  Aged Out   Meningococcal B Vaccine  Aged Out   Colonoscopy  Discontinued    Health Maintenance  There are no  preventive care reminders to display for this patient. Health Maintenance Items Addressed: Prescription sent in for new home glucose monitor with supplies. Patients current machine doesn't work all of the time  Additional Screening:  Vision Screening: Recommended annual ophthalmology exams for early detection of glaucoma and other disorders of the eye.  Dental Screening: Recommended annual dental exams for proper oral hygiene  Community Resource Referral / Chronic Care Management: CRR required this visit?  No   CCM required this visit?  No   Plan:    I have personally reviewed and noted the following in the patient's chart:   Medical and social history Use of alcohol, tobacco or illicit drugs  Current medications and supplements including opioid prescriptions. Patient is not currently taking opioid prescriptions. Functional ability and status Nutritional status Physical activity Advanced directives List of other physicians Hospitalizations, surgeries, and ER visits in previous 12 months Vitals Screenings to include cognitive, depression, and falls Referrals and appointments  In addition, I have reviewed and discussed with patient certain preventive protocols, quality metrics, and best practice recommendations. A written personalized  care plan for preventive services as well as general preventive health recommendations were provided to patient.   Cornelis Kluver, CMA   05/14/2024   After Visit Summary: (MyChart) Due to this being a telephonic visit, the after visit summary with patients personalized plan was offered to patient via MyChart   Notes: Please refer to Routing Comments.

## 2024-05-14 NOTE — Patient Instructions (Signed)
 Heidi Graham , Thank you for taking time out of your busy schedule to complete your Annual Wellness Visit with me. I enjoyed our conversation and look forward to speaking with you again next year. I, as well as your care team,  appreciate your ongoing commitment to your health goals. Please review the following plan we discussed and let me know if I can assist you in the future. Your Game plan/ To Do List    Referrals: If you haven't heard from the office you've been referred to, please reach out to them at the phone number provided.  After discussing the benefits of switching to a [facility] pharmacy, you decided to make the change to [facility]. A message has been sent to the pharmacy team to have them transfer your medications. You don't have to do anything. --A prescription for a new glucose machine with lancets and strips was sent in to [facility] for you today.  Follow up Visits: Next Medicare AWV with our clinical staff:   May 16, 2025 at 10:40 am video visit Have you seen your provider in the last 6 months (3 months if uncontrolled diabetes)? Yes Next Office Visit with your provider:   August 06, 2024 at 10:40am  Clinician Recommendations:   Aim for 30 minutes of exercise or brisk walking, 6-8 glasses of water, and 5 servings of fruits and vegetables each day.  I enjoyed our conversation today and look forward to talking with you again next year!! Have a wonderful and safe year. All the best, Heidi Graham This is a list of the screening recommended for you and due dates:  Health Maintenance  Topic Date Due   COVID-19 Vaccine (1) 05/22/2024*   Zoster (Shingles) Vaccine (1 of 2) 08/06/2024*   Eye exam for diabetics  07/06/2024   Flu Shot  07/16/2024   Yearly kidney health urinalysis for diabetes  09/21/2024   Hemoglobin A1C  11/06/2024   Mammogram  12/18/2024   Yearly kidney function blood test for diabetes  05/06/2025   Complete foot exam   05/06/2025   Medicare Annual Wellness  Visit  05/14/2025   DEXA scan (bone density measurement)  09/21/2025   Cologuard (Stool DNA test)  03/15/2027   DTaP/Tdap/Td vaccine (2 - Td or Tdap) 11/20/2030   Pneumonia Vaccine  Completed   Hepatitis C Screening  Completed   HPV Vaccine  Aged Out   Meningitis B Vaccine  Aged Out   Colon Cancer Screening  Discontinued  *Topic was postponed. The date shown is not the original due date.    Advanced directives: (Declined) Advance directive discussed with you today. Even though you declined this today, please call our office should you change your mind, and we can give you the proper paperwork for you to fill out. Advance Care Planning is important because it:  [x]  Makes sure you receive the medical care that is consistent with your values, goals, and preferences  [x]  It provides guidance to your family and loved ones and reduces their decisional burden about whether or not they are making the right decisions based on your wishes.  Follow the link provided in your after visit summary or read over the paperwork we have mailed to you to help you started getting your Advance Directives in place. If you need assistance in completing these, please reach out to us  so that we can help you! See attachments for Preventive Care and Fall Prevention Tips.  Understanding Your Risk for Falls Millions of  people have serious injuries from falls each year. It is important to understand your risk of falling. Talk with your health care provider about your risk and what you can do to lower it. If you do have a serious fall, make sure to tell your provider. Falling once raises your risk of falling again. How can falls affect me? Serious injuries from falls are common. These include: Broken bones, such as hip fractures. Head injuries, such as traumatic brain injuries (TBI) or concussions. A fear of falling can cause you to avoid activities and stay at home. This can make your muscles weaker and raise your risk  for a fall. What can increase my risk? There are a number of risk factors that increase your risk for falling. The more risk factors you have, the higher your risk of falling. Serious injuries from a fall happen most often to people who are older than 73 years old. Teenagers and young adults ages 20-29 are also at higher risk. Common risk factors include: Weakness in the lower body. Being generally weak or confused due to long-term (chronic) illness. Dizziness or balance problems. Poor vision. Medicines that cause dizziness or drowsiness. These may include: Medicines for your blood pressure, heart, anxiety, insomnia, or swelling (edema). Pain medicines. Muscle relaxants. Other risk factors include: Drinking alcohol. Having had a fall in the past. Having foot pain or wearing improper footwear. Working at a dangerous job. Having any of the following in your home: Tripping hazards, such as floor clutter or loose rugs. Poor lighting. Pets. Having dementia or memory loss. What actions can I take to lower my risk of falling?     Physical activity Stay physically fit. Do strength and balance exercises. Consider taking a regular class to build strength and balance. Yoga and tai chi are good options. Vision Have your eyes checked every year and your prescription for glasses or contacts updated as needed. Shoes and walking aids Wear non-skid shoes. Wear shoes that have rubber soles and low heels. Do not wear high heels. Do not walk around the house in socks or slippers. Use a cane or walker as told by your provider. Home safety Attach secure railings on both sides of your stairs. Install grab bars for your bathtub, shower, and toilet. Use a non-skid mat in your bathtub or shower. Attach bath mats securely with double-sided, non-slip rug tape. Use good lighting in all rooms. Keep a flashlight near your bed. Make sure there is a clear path from your bed to the bathroom. Use  night-lights. Do not use throw rugs. Make sure all carpeting is taped or tacked down securely. Remove all clutter from walkways and stairways, including extension cords. Repair uneven or broken steps and floors. Avoid walking on icy or slippery surfaces. Walk on the grass instead of on icy or slick sidewalks. Use ice melter to get rid of ice on walkways in the winter. Use a cordless phone. Questions to ask your health care provider Can you help me check my risk for a fall? Do any of my medicines make me more likely to fall? Should I take a vitamin D  supplement? What exercises can I do to improve my strength and balance? Should I make an appointment to have my vision checked? Do I need a bone density test to check for weak bones (osteoporosis)? Would it help to use a cane or a walker? Where to find more information Centers for Disease Control and Prevention, STEADI: TonerPromos.no Community-Based Fall Prevention Programs: TonerPromos.no [employer] on Aging: BaseRingTones.pl Contact a health care provider if: You fall at home. You are afraid of falling at home. You feel weak, drowsy, or dizzy. This information is not intended to replace advice given to you by your health care provider. Make sure you discuss any questions you have with your health care provider. Document Revised: 08/05/2022 Document Reviewed: 08/05/2022 Elsevier Patient Education  2024 [employer].  Understanding Your Risk for Falls Millions of people have serious injuries from falls each year. It is important to understand your risk of falling. Talk with your health care provider about your risk and what you can do to lower it. If you do have a serious fall, make sure to tell your provider. Falling once raises your risk of falling again. How can falls affect me? Serious injuries from falls are common. These include: Broken bones, such as hip fractures. Head injuries, such as traumatic brain injuries (TBI) or concussions. A fear  of falling can cause you to avoid activities and stay at home. This can make your muscles weaker and raise your risk for a fall. What can increase my risk? There are a number of risk factors that increase your risk for falling. The more risk factors you have, the higher your risk of falling. Serious injuries from a fall happen most often to people who are older than 73 years old. Teenagers and young adults ages 3-29 are also at higher risk. Common risk factors include: Weakness in the lower body. Being generally weak or confused due to long-term (chronic) illness. Dizziness or balance problems. Poor vision. Medicines that cause dizziness or drowsiness. These may include: Medicines for your blood pressure, heart, anxiety, insomnia, or swelling (edema). Pain medicines. Muscle relaxants. Other risk factors include: Drinking alcohol. Having had a fall in the past. Having foot pain or wearing improper footwear. Working at a dangerous job. Having any of the following in your home: Tripping hazards, such as floor clutter or loose rugs. Poor lighting. Pets. Having dementia or memory loss. What actions can I take to lower my risk of falling?     Physical activity Stay physically fit. Do strength and balance exercises. Consider taking a regular class to build strength and balance. Yoga and tai chi are good options. Vision Have your eyes checked every year and your prescription for glasses or contacts updated as needed. Shoes and walking aids Wear non-skid shoes. Wear shoes that have rubber soles and low heels. Do not wear high heels. Do not walk around the house in socks or slippers. Use a cane or walker as told by your provider. Home safety Attach secure railings on both sides of your stairs. Install grab bars for your bathtub, shower, and toilet. Use a non-skid mat in your bathtub or shower. Attach bath mats securely with double-sided, non-slip rug tape. Use good lighting in all  rooms. Keep a flashlight near your bed. Make sure there is a clear path from your bed to the bathroom. Use night-lights. Do not use throw rugs. Make sure all carpeting is taped or tacked down securely. Remove all clutter from walkways and stairways, including extension cords. Repair uneven or broken steps and floors. Avoid walking on icy or slippery surfaces. Walk on the grass instead of on icy or slick sidewalks. Use ice melter to get rid of ice on walkways in the winter. Use a cordless phone. Questions to ask your health care provider Can you help me check my risk for a fall? Do any of my  medicines make me more likely to fall? Should I take a vitamin D  supplement? What exercises can I do to improve my strength and balance? Should I make an appointment to have my vision checked? Do I need a bone density test to check for weak bones (osteoporosis)? Would it help to use a cane or a walker? Where to find more information Centers for Disease Control and Prevention, STEADI: TonerPromos.no Community-Based Fall Prevention Programs: TonerPromos.no [employer] on Aging: BaseRingTones.pl Contact a health care provider if: You fall at home. You are afraid of falling at home. You feel weak, drowsy, or dizzy. This information is not intended to replace advice given to you by your health care provider. Make sure you discuss any questions you have with your health care provider. Document Revised: 08/05/2022 Document Reviewed: 08/05/2022 Elsevier Patient Education  2024 [employer].

## 2024-05-24 ENCOUNTER — Other Ambulatory Visit (HOSPITAL_COMMUNITY): Payer: Self-pay

## 2024-05-25 ENCOUNTER — Other Ambulatory Visit: Payer: Self-pay | Admitting: Family

## 2024-05-25 DIAGNOSIS — E1169 Type 2 diabetes mellitus with other specified complication: Secondary | ICD-10-CM

## 2024-08-02 ENCOUNTER — Other Ambulatory Visit: Payer: Self-pay | Admitting: Family

## 2024-08-04 ENCOUNTER — Other Ambulatory Visit: Payer: Self-pay | Admitting: Family

## 2024-08-04 ENCOUNTER — Other Ambulatory Visit (HOSPITAL_COMMUNITY): Payer: Self-pay

## 2024-08-04 DIAGNOSIS — E1159 Type 2 diabetes mellitus with other circulatory complications: Secondary | ICD-10-CM

## 2024-08-06 ENCOUNTER — Encounter: Payer: Self-pay | Admitting: Family

## 2024-08-06 ENCOUNTER — Ambulatory Visit: Admitting: Family

## 2024-08-06 VITALS — BP 124/63 | HR 67 | Temp 97.8°F | Ht 63.0 in | Wt 142.6 lb

## 2024-08-06 DIAGNOSIS — E1159 Type 2 diabetes mellitus with other circulatory complications: Secondary | ICD-10-CM

## 2024-08-06 DIAGNOSIS — J42 Unspecified chronic bronchitis: Secondary | ICD-10-CM | POA: Diagnosis not present

## 2024-08-06 DIAGNOSIS — I152 Hypertension secondary to endocrine disorders: Secondary | ICD-10-CM | POA: Diagnosis not present

## 2024-08-06 DIAGNOSIS — Z951 Presence of aortocoronary bypass graft: Secondary | ICD-10-CM

## 2024-08-06 DIAGNOSIS — F17200 Nicotine dependence, unspecified, uncomplicated: Secondary | ICD-10-CM

## 2024-08-06 DIAGNOSIS — E785 Hyperlipidemia, unspecified: Secondary | ICD-10-CM

## 2024-08-06 DIAGNOSIS — I25709 Atherosclerosis of coronary artery bypass graft(s), unspecified, with unspecified angina pectoris: Secondary | ICD-10-CM | POA: Diagnosis not present

## 2024-08-06 DIAGNOSIS — Z7984 Long term (current) use of oral hypoglycemic drugs: Secondary | ICD-10-CM

## 2024-08-06 DIAGNOSIS — E1169 Type 2 diabetes mellitus with other specified complication: Secondary | ICD-10-CM | POA: Diagnosis not present

## 2024-08-06 LAB — CMP14+EGFR
ALT: 9 IU/L (ref 0–32)
AST: 15 IU/L (ref 0–40)
Albumin: 4.4 g/dL (ref 3.8–4.8)
Alkaline Phosphatase: 72 IU/L (ref 44–121)
BUN/Creatinine Ratio: 20 (ref 12–28)
BUN: 14 mg/dL (ref 8–27)
Bilirubin Total: 0.3 mg/dL (ref 0.0–1.2)
CO2: 22 mmol/L (ref 20–29)
Calcium: 9.5 mg/dL (ref 8.7–10.3)
Chloride: 103 mmol/L (ref 96–106)
Creatinine, Ser: 0.7 mg/dL (ref 0.57–1.00)
Globulin, Total: 2.3 g/dL (ref 1.5–4.5)
Glucose: 178 mg/dL — ABNORMAL HIGH (ref 70–99)
Potassium: 4.5 mmol/L (ref 3.5–5.2)
Sodium: 142 mmol/L (ref 134–144)
Total Protein: 6.7 g/dL (ref 6.0–8.5)
eGFR: 91 mL/min/1.73 (ref >59)

## 2024-08-06 LAB — BAYER DCA HB A1C WAIVED: HB A1C (BAYER DCA - WAIVED): 6.6 % — ABNORMAL HIGH (ref 4.8–5.6)

## 2024-08-06 MED ORDER — SEMAGLUTIDE (1 MG/DOSE) 4 MG/3ML ~~LOC~~ SOPN
1.0000 mg | PEN_INJECTOR | SUBCUTANEOUS | 3 refills | Status: AC
Start: 2024-08-06 — End: ?

## 2024-08-06 MED ORDER — EMPAGLIFLOZIN 25 MG PO TABS: 25.0000 mg | ORAL_TABLET | Freq: Every day | ORAL | 2 refills | Status: DC

## 2024-08-06 MED ORDER — METOPROLOL SUCCINATE ER 25 MG PO TB24: 25.0000 mg | ORAL_TABLET | Freq: Every day | ORAL | 0 refills | Status: DC

## 2024-08-06 MED ORDER — METFORMIN HCL ER 750 MG PO TB24: ORAL_TABLET | ORAL | 0 refills | Status: DC

## 2024-08-06 MED ORDER — BENZONATATE 200 MG PO CAPS: 200.0000 mg | ORAL_CAPSULE | Freq: Three times a day (TID) | ORAL | 1 refills | Status: DC | PRN

## 2024-08-06 NOTE — Patient Instructions (Signed)
 Health Maintenance After Age 73 After age 27, you are at a higher risk for certain long-term diseases and infections as well as injuries from falls. Falls are a major cause of broken bones and head injuries in people who are older than age 73. Getting regular preventive care can help to keep you healthy and well. Preventive care includes getting regular testing and making lifestyle changes as recommended by your health care provider. Talk with your health care provider about: Which screenings and tests you should have. A screening is a test that checks for a disease when you have no symptoms. A diet and exercise plan that is right for you. What should I know about screenings and tests to prevent falls? Screening and testing are the best ways to find a health problem early. Early diagnosis and treatment give you the best chance of managing medical conditions that are common after age 90. Certain conditions and lifestyle choices may make you more likely to have a fall. Your health care provider may recommend: Regular vision checks. Poor vision and conditions such as cataracts can make you more likely to have a fall. If you wear glasses, make sure to get your prescription updated if your vision changes. Medicine review. Work with your health care provider to regularly review all of the medicines you are taking, including over-the-counter medicines. Ask your health care provider about any side effects that may make you more likely to have a fall. Tell your health care provider if any medicines that you take make you feel dizzy or sleepy. Strength and balance checks. Your health care provider may recommend certain tests to check your strength and balance while standing, walking, or changing positions. Foot health exam. Foot pain and numbness, as well as not wearing proper footwear, can make you more likely to have a fall. Screenings, including: Osteoporosis screening. Osteoporosis is a condition that causes  the bones to get weaker and break more easily. Blood pressure screening. Blood pressure changes and medicines to control blood pressure can make you feel dizzy. Depression screening. You may be more likely to have a fall if you have a fear of falling, feel depressed, or feel unable to do activities that you used to do. Alcohol  use screening. Using too much alcohol  can affect your balance and may make you more likely to have a fall. Follow these instructions at home: Lifestyle Do not drink alcohol  if: Your health care provider tells you not to drink. If you drink alcohol : Limit how much you have to: 0-1 drink a day for women. 0-2 drinks a day for men. Know how much alcohol  is in your drink. In the [place] one drink equals one 12 oz bottle of beer (355 mL), one 5 oz glass of wine (148 mL), or one 1 oz glass of hard liquor (44 mL). Do not use any products that contain nicotine or tobacco. These products include cigarettes, chewing tobacco, and vaping devices, such as e-cigarettes. If you need help quitting, ask your health care provider. Activity  Follow a regular exercise program to stay fit. This will help you maintain your balance. Ask your health care provider what types of exercise are appropriate for you. If you need a cane or walker, use it as recommended by your health care provider. Wear supportive shoes that have nonskid soles. Safety  Remove any tripping hazards, such as rugs, cords, and clutter. Install safety equipment such as grab bars in bathrooms and safety rails on stairs. Keep rooms and walkways  well-lit. General instructions Talk with your health care provider about your risks for falling. Tell your health care provider if: You fall. Be sure to tell your health care provider about all falls, even ones that seem minor. You feel dizzy, tiredness (fatigue), or off-balance. Take over-the-counter and prescription medicines only as told by your health care provider. These include  supplements. Eat a healthy diet and maintain a healthy weight. A healthy diet includes low-fat dairy products, low-fat (lean) meats, and fiber from whole grains, beans, and lots of fruits and vegetables. Stay current with your vaccines. Schedule regular health, dental, and eye exams. Summary Having a healthy lifestyle and getting preventive care can help to protect your health and wellness after age 15. Screening and testing are the best way to find a health problem early and help you avoid having a fall. Early diagnosis and treatment give you the best chance for managing medical conditions that are more common for people who are older than age 42. Falls are a major cause of broken bones and head injuries in people who are older than age 64. Take precautions to prevent a fall at home. Work with your health care provider to learn what changes you can make to improve your health and wellness and to prevent falls. This information is not intended to replace advice given to you by your health care provider. Make sure you discuss any questions you have with your health care provider. Document Revised: 04/23/2021 Document Reviewed: 04/23/2021 Elsevier Patient Education  2024 [employer].

## 2024-08-06 NOTE — Progress Notes (Signed)
 Subjective:    Patient ID: Heidi Graham, female    DOB: Apr 08, 1951, 73 y.o.   MRN: [id number]  Chief Complaint  Patient presents with   Medical Management of Chronic Issues   Pt presents to the office today for chronic follow up.    PT had CABG in 2011 and 2017 is followed by Cardiologists annually.    She has atherosclerosis and takes Crestor  daily.    She has COPD and continues to smoke 1/4 pack a day. She has intermittent SOB.   Hypertension This is a chronic problem. The current episode started more than 1 year ago. The problem has been waxing and waning since onset. The problem is uncontrolled. Associated symptoms include malaise/fatigue. Pertinent negatives include no blurred vision or peripheral edema. Risk factors for coronary artery disease include dyslipidemia, diabetes mellitus and sedentary lifestyle. The current treatment provides moderate improvement.  Diabetes She presents for her follow-up diabetic visit. She has type 2 diabetes mellitus. Pertinent negatives for diabetes include no blurred vision and no foot paresthesias. Risk factors for coronary artery disease include dyslipidemia, diabetes mellitus, hypertension, sedentary lifestyle and post-menopausal. She is following a generally healthy diet. Her overall blood glucose range is 110-130 mg/dl. Eye exam is current.  Hyperlipidemia This is a chronic problem. The current episode started more than 1 year ago. The problem is controlled. Recent lipid tests were reviewed and are normal. Current antihyperlipidemic treatment includes statins. The current treatment provides moderate improvement of lipids. Risk factors for coronary artery disease include dyslipidemia, diabetes mellitus, hypertension, a sedentary lifestyle and post-menopausal.  Nicotine Dependence Presents for follow-up visit. The symptoms have been stable. She smokes < 1/2 a pack of cigarettes per day.      Review of Systems  Constitutional:  Positive for  malaise/fatigue.  Eyes:  Negative for blurred vision.  All other systems reviewed and are negative.  Family History  Problem Relation Age of Onset   Diabetes Mother    Hypertension Mother    Diabetes Sister        type 2   Hypertension Sister    Diabetes Daughter    Diabetes Brother        type 1   Diabetes Son    Breast cancer Neg Hx    Social History   Socioeconomic History   Marital status: Widowed    Spouse name: Not on file   Number of children: 3   Years of education: 97   Highest education level: 12th grade  Occupational History   Occupation: retired   Tobacco Use   Smoking status: Former    Current packs/day: 0.00    Average packs/day: 0.3 packs/day for 28.0 years (8.4 ttl pk-yrs)    Types: Cigarettes    Start date: 01/16/1994    Quit date: 01/16/2022    Years since quitting: 2.5    Passive exposure: Past   Smokeless tobacco: Never   Tobacco comments:    smoked for 20 years - quit for 9 years and restarted about 6 yeasr ago.    Quit again 01/16/2022  Vaping Use   Vaping status: Never Used  Substance and Sexual Activity   Alcohol use: No   Drug use: No   Sexual activity: Not Currently    Partners: Male  Other Topics Concern   Not on file  Social History Narrative   Lives with daughter and 2 grandchildren    Son is truck Hospital doctor and lives in [place]   Other son in  [place], [place]   Social Drivers of Health   Financial Resource Strain: Low Risk  (05/14/2024)   Overall Financial Resource Strain (CARDIA)    Difficulty of Paying Living Expenses: Not hard at all  Food Insecurity: No Food Insecurity (05/14/2024)   Hunger Vital Sign    Worried About Running Out of Food in the Last Year: Never true    Ran Out of Food in the Last Year: Never true  Transportation Needs: No Transportation Needs (05/14/2024)   PRAPARE - Administrator, Civil Service (Medical): No    Lack of Transportation (Non-Medical): No  Physical Activity: Sufficiently Active  (05/14/2024)   Exercise Vital Sign    Days of Exercise per Week: 7 days    Minutes of Exercise per Session: 30 min  Stress: No Stress Concern Present (05/14/2024)   [employer] of Occupational Health - Occupational Stress Questionnaire    Feeling of Stress : Not at all  Social Connections: Moderately Integrated (05/14/2024)   Social Connection and Isolation Panel    Frequency of Communication with Friends and Family: More than three times a week    Frequency of Social Gatherings with Friends and Family: More than three times a week    Attends Religious Services: More than 4 times per year    Active Member of [employer] or Organizations: Yes    Attends Banker Meetings: More than 4 times per year    Marital Status: Widowed       Objective:   Physical Exam Vitals reviewed.  Constitutional:      General: She is not in acute distress.    Appearance: She is well-developed.  HENT:     Head: Normocephalic and atraumatic.     Right Ear: Tympanic membrane normal.     Left Ear: Tympanic membrane normal.     Nose:     Right Sinus: No frontal sinus tenderness.     Left Sinus: No frontal sinus tenderness.  Eyes:     Pupils: Pupils are equal, round, and reactive to light.  Neck:     Thyroid : No thyromegaly.  Cardiovascular:     Rate and Rhythm: Normal rate and regular rhythm.     Heart sounds: Normal heart sounds. No murmur heard. Pulmonary:     Effort: Pulmonary effort is normal. No respiratory distress.     Breath sounds: Normal breath sounds. No wheezing.  Abdominal:     General: Bowel sounds are normal. There is no distension.     Palpations: Abdomen is soft.     Tenderness: There is no abdominal tenderness.  Musculoskeletal:        General: No tenderness. Normal range of motion.     Cervical back: Normal range of motion and neck supple.  Skin:    General: Skin is warm and dry.  Neurological:     Mental Status: She is alert and oriented to person, place, and time.      Cranial Nerves: No cranial nerve deficit.     Deep Tendon Reflexes: Reflexes are normal and symmetric.  Psychiatric:        Behavior: Behavior normal.        Thought Content: Thought content normal.        Judgment: Judgment normal.      BP 124/63   Pulse 67   Temp 97.8 F (36.6 C) (Temporal)   Ht 5' 3 (1.6 m)   Wt 142 lb 9.6 oz (64.7 kg)   BMI  25.26 kg/m      Assessment & Plan:  CAYMAN BROGDEN comes in today with chief complaint of Medical Management of Chronic Issues   Diagnosis and orders addressed:  1. Type 2 diabetes mellitus with other specified complication, without long-term current use of insulin (HCC) (Primary) - empagliflozin  (JARDIANCE ) 25 MG TABS tablet; Take 1 tablet (25 mg total) by mouth daily.  Dispense: 90 tablet; Refill: 2 - metFORMIN  (GLUCOPHAGE -XR) 750 MG 24 hr tablet; TAKE 2 TABLETS (1,500 MG TOTAL) EVERY DAY WITH BREAKFAST  Dispense: 180 tablet; Refill: 0 - Bayer DCA Hb A1c Waived - CMP14+EGFR - Semaglutide , 1 MG/DOSE, 4 MG/3ML SOPN; Inject 1 mg as directed once a week.  Dispense: 3 mL; Refill: 3  2. Hypertension associated with diabetes (HCC) - metoprolol  succinate (TOPROL -XL) 25 MG 24 hr tablet; Take 1 tablet (25 mg total) by mouth daily.  Dispense: 90 tablet; Refill: 0 - CMP14+EGFR  3. Hyperlipidemia associated with type 2 diabetes mellitus (HCC) - CMP14+EGFR  4. Current smoker - CMP14+EGFR  5. Hx of CABG - CMP14+EGFR  6. Hyperlipidemia, unspecified hyperlipidemia type - CMP14+EGFR  7. 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 Chronic bronchitis, unspecified chronic bronchitis type (HCC) - benzonatate  (TESSALON ) 200 MG capsule; Take 1 capsule (200 mg total) by mouth 3 (three) times daily as needed.  Dispense: 30 capsule; Refill: 1 - CMP14+EGFR    Labs pending Continue current medications  Health Maintenance reviewed Diet and  exercise encouraged  Follow up plan: 4 months    Bari Learn, FNP

## 2024-08-09 ENCOUNTER — Ambulatory Visit: Payer: Self-pay | Admitting: Family

## 2024-10-04 NOTE — Progress Notes (Signed)
 Heidi Graham                                          MRN: [id number]   10/04/2024   The [facility] Quality Team Specialist reviewed this patient medical record for the purposes of chart review for care gap closure. The following were reviewed: chart review for care gap closure-kidney health evaluation for diabetes:eGFR  and uACR.    [facility] Quality Team

## 2024-10-23 ENCOUNTER — Other Ambulatory Visit: Payer: Self-pay | Admitting: *Deleted

## 2024-11-19 ENCOUNTER — Other Ambulatory Visit: Payer: Self-pay | Admitting: Family

## 2024-11-19 DIAGNOSIS — E1169 Type 2 diabetes mellitus with other specified complication: Secondary | ICD-10-CM

## 2024-11-19 DIAGNOSIS — Z951 Presence of aortocoronary bypass graft: Secondary | ICD-10-CM

## 2024-11-29 ENCOUNTER — Other Ambulatory Visit: Payer: Self-pay | Admitting: Family

## 2024-11-29 DIAGNOSIS — Z1231 Encounter for screening mammogram for malignant neoplasm of breast: Secondary | ICD-10-CM

## 2024-12-06 ENCOUNTER — Encounter: Payer: Self-pay | Admitting: Family

## 2024-12-06 ENCOUNTER — Ambulatory Visit: Payer: Self-pay | Admitting: Family

## 2024-12-06 VITALS — BP 132/52 | HR 68 | Temp 96.9°F | Ht 63.0 in | Wt 135.6 lb

## 2024-12-06 DIAGNOSIS — E785 Hyperlipidemia, unspecified: Secondary | ICD-10-CM

## 2024-12-06 DIAGNOSIS — E1159 Type 2 diabetes mellitus with other circulatory complications: Secondary | ICD-10-CM

## 2024-12-06 DIAGNOSIS — Z7984 Long term (current) use of oral hypoglycemic drugs: Secondary | ICD-10-CM

## 2024-12-06 DIAGNOSIS — E1169 Type 2 diabetes mellitus with other specified complication: Secondary | ICD-10-CM | POA: Diagnosis not present

## 2024-12-06 DIAGNOSIS — F17200 Nicotine dependence, unspecified, uncomplicated: Secondary | ICD-10-CM

## 2024-12-06 DIAGNOSIS — Z951 Presence of aortocoronary bypass graft: Secondary | ICD-10-CM | POA: Diagnosis not present

## 2024-12-06 DIAGNOSIS — I25709 Atherosclerosis of coronary artery bypass graft(s), unspecified, with unspecified angina pectoris: Secondary | ICD-10-CM

## 2024-12-06 DIAGNOSIS — J42 Unspecified chronic bronchitis: Secondary | ICD-10-CM

## 2024-12-06 DIAGNOSIS — I152 Hypertension secondary to endocrine disorders: Secondary | ICD-10-CM

## 2024-12-06 LAB — CMP14+EGFR
ALT: 10 IU/L (ref 0–32)
AST: 15 IU/L (ref 0–40)
Albumin: 4.5 g/dL (ref 3.8–4.8)
Alkaline Phosphatase: 71 IU/L (ref 49–135)
BUN/Creatinine Ratio: 22 (ref 12–28)
BUN: 15 mg/dL (ref 8–27)
Bilirubin Total: 0.6 mg/dL (ref 0.0–1.2)
CO2: 24 mmol/L (ref 20–29)
Calcium: 9.7 mg/dL (ref 8.7–10.3)
Chloride: 102 mmol/L (ref 96–106)
Creatinine, Ser: 0.69 mg/dL (ref 0.57–1.00)
Globulin, Total: 2.1 g/dL (ref 1.5–4.5)
Glucose: 122 mg/dL — ABNORMAL HIGH (ref 70–99)
Potassium: 4.1 mmol/L (ref 3.5–5.2)
Sodium: 140 mmol/L (ref 134–144)
Total Protein: 6.6 g/dL (ref 6.0–8.5)
eGFR: 92 mL/min/1.73

## 2024-12-06 LAB — LIPID PANEL
Chol/HDL Ratio: 4 ratio (ref 0.0–4.4)
Cholesterol, Total: 143 mg/dL (ref 100–199)
HDL: 36 mg/dL — ABNORMAL LOW
LDL Chol Calc (NIH): 90 mg/dL (ref 0–99)
Triglycerides: 88 mg/dL (ref 0–149)
VLDL Cholesterol Cal: 17 mg/dL (ref 5–40)

## 2024-12-06 LAB — BAYER DCA HB A1C WAIVED: HB A1C (BAYER DCA - WAIVED): 6 % — ABNORMAL HIGH (ref 4.8–5.6)

## 2024-12-06 MED ORDER — EMPAGLIFLOZIN 25 MG PO TABS: 25.0000 mg | ORAL_TABLET | Freq: Every day | ORAL | 2 refills | Status: AC

## 2024-12-06 MED ORDER — METFORMIN HCL ER 750 MG PO TB24: ORAL_TABLET | ORAL | 3 refills | Status: AC

## 2024-12-06 MED ORDER — ISOSORBIDE MONONITRATE ER 30 MG PO TB24: 30.0000 mg | ORAL_TABLET | Freq: Every day | ORAL | 3 refills | Status: AC

## 2024-12-06 MED ORDER — LISINOPRIL 10 MG PO TABS: 10.0000 mg | ORAL_TABLET | Freq: Every day | ORAL | 3 refills | Status: AC

## 2024-12-06 MED ORDER — METFORMIN HCL ER 750 MG PO TB24: ORAL_TABLET | ORAL | 0 refills | Status: DC

## 2024-12-06 MED ORDER — ROSUVASTATIN CALCIUM 10 MG PO TABS: 10.0000 mg | ORAL_TABLET | Freq: Every day | ORAL | 3 refills | Status: AC

## 2024-12-06 MED ORDER — METOPROLOL SUCCINATE ER 25 MG PO TB24: 25.0000 mg | ORAL_TABLET | Freq: Every day | ORAL | 2 refills | Status: AC

## 2024-12-06 MED ORDER — SEMAGLUTIDE (1 MG/DOSE) 4 MG/3ML ~~LOC~~ SOPN: 1.0000 mg | PEN_INJECTOR | SUBCUTANEOUS | 3 refills | Status: AC

## 2024-12-06 NOTE — Patient Instructions (Signed)
 Health Maintenance After Age 73 After age 27, you are at a higher risk for certain long-term diseases and infections as well as injuries from falls. Falls are a major cause of broken bones and head injuries in people who are older than age 73. Getting regular preventive care can help to keep you healthy and well. Preventive care includes getting regular testing and making lifestyle changes as recommended by your health care provider. Talk with your health care provider about: Which screenings and tests you should have. A screening is a test that checks for a disease when you have no symptoms. A diet and exercise plan that is right for you. What should I know about screenings and tests to prevent falls? Screening and testing are the best ways to find a health problem early. Early diagnosis and treatment give you the best chance of managing medical conditions that are common after age 90. Certain conditions and lifestyle choices may make you more likely to have a fall. Your health care provider may recommend: Regular vision checks. Poor vision and conditions such as cataracts can make you more likely to have a fall. If you wear glasses, make sure to get your prescription updated if your vision changes. Medicine review. Work with your health care provider to regularly review all of the medicines you are taking, including over-the-counter medicines. Ask your health care provider about any side effects that may make you more likely to have a fall. Tell your health care provider if any medicines that you take make you feel dizzy or sleepy. Strength and balance checks. Your health care provider may recommend certain tests to check your strength and balance while standing, walking, or changing positions. Foot health exam. Foot pain and numbness, as well as not wearing proper footwear, can make you more likely to have a fall. Screenings, including: Osteoporosis screening. Osteoporosis is a condition that causes  the bones to get weaker and break more easily. Blood pressure screening. Blood pressure changes and medicines to control blood pressure can make you feel dizzy. Depression screening. You may be more likely to have a fall if you have a fear of falling, feel depressed, or feel unable to do activities that you used to do. Alcohol  use screening. Using too much alcohol  can affect your balance and may make you more likely to have a fall. Follow these instructions at home: Lifestyle Do not drink alcohol  if: Your health care provider tells you not to drink. If you drink alcohol : Limit how much you have to: 0-1 drink a day for women. 0-2 drinks a day for men. Know how much alcohol  is in your drink. In the [place] one drink equals one 12 oz bottle of beer (355 mL), one 5 oz glass of wine (148 mL), or one 1 oz glass of hard liquor (44 mL). Do not use any products that contain nicotine or tobacco. These products include cigarettes, chewing tobacco, and vaping devices, such as e-cigarettes. If you need help quitting, ask your health care provider. Activity  Follow a regular exercise program to stay fit. This will help you maintain your balance. Ask your health care provider what types of exercise are appropriate for you. If you need a cane or walker, use it as recommended by your health care provider. Wear supportive shoes that have nonskid soles. Safety  Remove any tripping hazards, such as rugs, cords, and clutter. Install safety equipment such as grab bars in bathrooms and safety rails on stairs. Keep rooms and walkways  well-lit. General instructions Talk with your health care provider about your risks for falling. Tell your health care provider if: You fall. Be sure to tell your health care provider about all falls, even ones that seem minor. You feel dizzy, tiredness (fatigue), or off-balance. Take over-the-counter and prescription medicines only as told by your health care provider. These include  supplements. Eat a healthy diet and maintain a healthy weight. A healthy diet includes low-fat dairy products, low-fat (lean) meats, and fiber from whole grains, beans, and lots of fruits and vegetables. Stay current with your vaccines. Schedule regular health, dental, and eye exams. Summary Having a healthy lifestyle and getting preventive care can help to protect your health and wellness after age 15. Screening and testing are the best way to find a health problem early and help you avoid having a fall. Early diagnosis and treatment give you the best chance for managing medical conditions that are more common for people who are older than age 42. Falls are a major cause of broken bones and head injuries in people who are older than age 64. Take precautions to prevent a fall at home. Work with your health care provider to learn what changes you can make to improve your health and wellness and to prevent falls. This information is not intended to replace advice given to you by your health care provider. Make sure you discuss any questions you have with your health care provider. Document Revised: 04/23/2021 Document Reviewed: 04/23/2021 Elsevier Patient Education  2024 [employer].

## 2024-12-06 NOTE — Progress Notes (Signed)
 "  Subjective:    Patient ID: Heidi Graham, female    DOB: 07-28-1951, 73 y.o.   MRN: [id number]  Chief Complaint  Patient presents with   Medical Management of Chronic Issues   Pt presents to the office today for chronic follow up.    PT had CABG in 2011 and 2017 is followed by Cardiologists annually.    She has atherosclerosis and takes Crestor  daily.    She has COPD and continues to smoke 1/4 pack a day. She has intermittent SOB.   Hypertension This is a chronic problem. The current episode started more than 1 year ago. The problem has been resolved since onset. The problem is controlled. Associated symptoms include shortness of breath (some times). Pertinent negatives include no blurred vision, malaise/fatigue or peripheral edema. Risk factors for coronary artery disease include dyslipidemia, diabetes mellitus and sedentary lifestyle. The current treatment provides moderate improvement.  Diabetes She presents for her follow-up diabetic visit. She has type 2 diabetes mellitus. Pertinent negatives for diabetes include no blurred vision and no foot paresthesias. Risk factors for coronary artery disease include dyslipidemia, diabetes mellitus, hypertension, sedentary lifestyle and post-menopausal. She is following a generally healthy diet. Her overall blood glucose range is 110-130 mg/dl. Eye exam is current.  Hyperlipidemia This is a chronic problem. The current episode started more than 1 year ago. The problem is controlled. Recent lipid tests were reviewed and are normal. Associated symptoms include shortness of breath (some times). Current antihyperlipidemic treatment includes statins. The current treatment provides moderate improvement of lipids. Risk factors for coronary artery disease include dyslipidemia, diabetes mellitus, hypertension, a sedentary lifestyle and post-menopausal.  Nicotine Dependence Presents for follow-up visit. The symptoms have been stable. She smokes < 1/2 a pack  of cigarettes per day.      Review of Systems  Constitutional:  Negative for malaise/fatigue.  Eyes:  Negative for blurred vision.  Respiratory:  Positive for shortness of breath (some times).   All other systems reviewed and are negative.  Family History  Problem Relation Age of Onset   Diabetes Mother    Hypertension Mother    Diabetes Sister        type 2   Hypertension Sister    Diabetes Daughter    Diabetes Brother        type 1   Diabetes Son    Breast cancer Neg Hx    Social History   Socioeconomic History   Marital status: Widowed    Spouse name: Not on file   Number of children: 3   Years of education: 36   Highest education level: 12th grade  Occupational History   Occupation: retired   Tobacco Use   Smoking status: Former    Current packs/day: 0.00    Average packs/day: 0.3 packs/day for 28.0 years (8.4 ttl pk-yrs)    Types: Cigarettes    Start date: 01/16/1994    Quit date: 01/16/2022    Years since quitting: 2.8    Passive exposure: Past   Smokeless tobacco: Never   Tobacco comments:    smoked for 20 years - quit for 9 years and restarted about 6 yeasr ago.    Quit again 01/16/2022  Vaping Use   Vaping status: Never Used  Substance and Sexual Activity   Alcohol use: No   Drug use: No   Sexual activity: Not Currently    Partners: Male  Other Topics Concern   Not on file  Social History Narrative  Lives with daughter and 2 grandchildren    Son is truck hospital doctor and lives in [place]   Other son in [place], [place]   Social Drivers of Health   Tobacco Use: Medium Risk (12/06/2024)   Patient History    Smoking Tobacco Use: Former    Smokeless Tobacco Use: Never    Passive Exposure: Past  Physicist, Medical Strain: Low Risk (05/14/2024)   Overall Financial Resource Strain (CARDIA)    Difficulty of Paying Living Expenses: Not hard at all  Food Insecurity: No Food Insecurity (05/14/2024)   Hunger Vital Sign    Worried About Running Out of Food  in the Last Year: Never true    Ran Out of Food in the Last Year: Never true  Transportation Needs: No Transportation Needs (05/14/2024)   PRAPARE - Administrator, Civil Service (Medical): No    Lack of Transportation (Non-Medical): No  Physical Activity: Sufficiently Active (05/14/2024)   Exercise Vital Sign    Days of Exercise per Week: 7 days    Minutes of Exercise per Session: 30 min  Stress: No Stress Concern Present (05/14/2024)   [employer] of Occupational Health - Occupational Stress Questionnaire    Feeling of Stress : Not at all  Social Connections: Moderately Integrated (05/14/2024)   Social Connection and Isolation Panel    Frequency of Communication with Friends and Family: More than three times a week    Frequency of Social Gatherings with Friends and Family: More than three times a week    Attends Religious Services: More than 4 times per year    Active Member of [employer] or Organizations: Yes    Attends Banker Meetings: More than 4 times per year    Marital Status: Widowed  Depression (PHQ2-9): Low Risk (08/06/2024)   Depression (PHQ2-9)    PHQ-2 Score: 0  Alcohol Screen: Low Risk (05/14/2024)   Alcohol Screen    Last Alcohol Screening Score (AUDIT): 0  Housing: Low Risk (05/14/2024)   Housing Stability Vital Sign    Unable to Pay for Housing in the Last Year: No    Number of Times Moved in the Last Year: 0    Homeless in the Last Year: No  Utilities: Not At Risk (05/14/2024)   AHC Utilities    Threatened with loss of utilities: No  Health Literacy: Adequate Health Literacy (05/14/2024)   B1300 Health Literacy    Frequency of need for help with medical instructions: Never       Objective:   Physical Exam Vitals reviewed.  Constitutional:      General: She is not in acute distress.    Appearance: She is well-developed.  HENT:     Head: Normocephalic and atraumatic.     Right Ear: Tympanic membrane normal.     Left Ear: Tympanic  membrane normal.     Nose:     Right Sinus: No frontal sinus tenderness.     Left Sinus: No frontal sinus tenderness.  Eyes:     Pupils: Pupils are equal, round, and reactive to light.  Neck:     Thyroid : No thyromegaly.  Cardiovascular:     Rate and Rhythm: Normal rate and regular rhythm.     Heart sounds: Murmur heard.  Pulmonary:     Effort: Pulmonary effort is normal. No respiratory distress.     Breath sounds: Normal breath sounds. No wheezing.  Abdominal:     General: Bowel sounds are normal. There is no  distension.     Palpations: Abdomen is soft.     Tenderness: There is no abdominal tenderness.  Musculoskeletal:        General: No tenderness. Normal range of motion.     Cervical back: Normal range of motion and neck supple.  Skin:    General: Skin is warm and dry.  Neurological:     Mental Status: She is alert and oriented to person, place, and time.     Cranial Nerves: No cranial nerve deficit.     Deep Tendon Reflexes: Reflexes are normal and symmetric.  Psychiatric:        Behavior: Behavior normal.        Thought Content: Thought content normal.        Judgment: Judgment normal.      BP (!) 132/52   Pulse 68   Temp (!) 96.9 F (36.1 C) (Temporal)   Ht 5' 3 (1.6 m)   Wt 135 lb 9.6 oz (61.5 kg)   SpO2 98%   BMI 24.02 kg/m      Assessment & Plan:  Heidi Graham comes in today with chief complaint of Medical Management of Chronic Issues   Diagnosis and orders addressed:  1. Type 2 diabetes mellitus with other specified complication, without long-term current use of insulin (HCC) - Bayer DCA Hb A1c Waived - CMP14+EGFR - Microalbumin / creatinine urine ratio - empagliflozin  (JARDIANCE ) 25 MG TABS tablet; Take 1 tablet (25 mg total) by mouth daily.  Dispense: 90 tablet; Refill: 2 - Semaglutide , 1 MG/DOSE, 4 MG/3ML SOPN; Inject 1 mg as directed once a week.  Dispense: 3 mL; Refill: 3 - metFORMIN  (GLUCOPHAGE -XR) 750 MG 24 hr tablet; TAKE 2 TABLETS  (1,500 MG TOTAL) EVERY DAY WITH BREAKFAST  Dispense: 180 tablet; Refill: 3  2. Hx of CABG - CMP14+EGFR - isosorbide  mononitrate (IMDUR ) 30 MG 24 hr tablet; Take 1 tablet (30 mg total) by mouth daily.  Dispense: 90 tablet; Refill: 3  3. Hypertension associated with diabetes (HCC) - CMP14+EGFR - metoprolol  succinate (TOPROL -XL) 25 MG 24 hr tablet; Take 1 tablet (25 mg total) by mouth daily.  Dispense: 90 tablet; Refill: 2  4. Hyperlipidemia associated with type 2 diabetes mellitus (HCC)  - Lipid panel - CMP14+EGFR - rosuvastatin  (CRESTOR ) 10 MG tablet; Take 1 tablet (10 mg total) by mouth daily.  Dispense: 90 tablet; Refill: 3  5. Atherosclerosis of coronary artery bypass graft with angina pectoris, unspecified whether native or transplanted heart (Primary) - CMP14+EGFR  6. Current smoker - CMP14+EGFR  7. Chronic bronchitis, unspecified chronic bronchitis type (HCC)  - CMP14+EGFR  8. Hyperlipidemia, unspecified hyperlipidemia type - CMP14+EGFR   Labs pending Continue current medications  Health Maintenance reviewed- Refuses shingles vaccine Diet and exercise encouraged  Follow up plan: 4 months    Bari Learn, FNP   "

## 2024-12-07 ENCOUNTER — Ambulatory Visit: Payer: Self-pay | Admitting: Family

## 2024-12-07 LAB — MICROALBUMIN / CREATININE URINE RATIO
Creatinine, Urine: 71.1 mg/dL
Microalb/Creat Ratio: 34 mg/g{creat} — ABNORMAL HIGH (ref 0–29)
Microalbumin, Urine: 24.4 ug/mL

## 2024-12-28 ENCOUNTER — Ambulatory Visit
Admission: RE | Admit: 2024-12-28 | Discharge: 2024-12-28 | Disposition: A | Source: Ambulatory Visit | Attending: Family | Admitting: Family

## 2024-12-28 DIAGNOSIS — Z1231 Encounter for screening mammogram for malignant neoplasm of breast: Secondary | ICD-10-CM

## 2024-12-31 ENCOUNTER — Telehealth: Payer: Self-pay | Admitting: *Deleted

## 2024-12-31 NOTE — Telephone Encounter (Signed)
 Pt returned my call, rcvd ppw from [facility] to change her Diabetic supplies to them. Pt does not want to change. Wrote Not authorized, do not resend and faxed back to company.

## 2025-04-08 ENCOUNTER — Ambulatory Visit: Admitting: Family

## 2025-05-16 ENCOUNTER — Ambulatory Visit: Payer: Self-pay
